# Patient Record
Sex: Female | Born: 1965 | Race: White | Hispanic: No | Marital: Married | Smoking: Never smoker
Health system: Southern US, Community
[De-identification: ages and names within clinical notes are randomized; demographics above are authoritative.]

## PROBLEM LIST (undated history)

## (undated) DIAGNOSIS — T7840XA Allergy, unspecified, initial encounter: Secondary | ICD-10-CM

## (undated) DIAGNOSIS — E079 Disorder of thyroid, unspecified: Secondary | ICD-10-CM

## (undated) DIAGNOSIS — C801 Malignant (primary) neoplasm, unspecified: Secondary | ICD-10-CM

## (undated) HISTORY — PX: WISDOM TOOTH EXTRACTION: SHX21

## (undated) HISTORY — DX: Allergy, unspecified, initial encounter: T78.40XA

## (undated) HISTORY — DX: Disorder of thyroid, unspecified: E07.9

## (undated) HISTORY — DX: Malignant (primary) neoplasm, unspecified: C80.1

## (undated) HISTORY — PX: TOTAL THYROIDECTOMY: SHX2547

---

## 1999-02-01 ENCOUNTER — Other Ambulatory Visit: Admission: RE | Admit: 1999-02-01 | Discharge: 1999-02-01 | Payer: Self-pay | Admitting: *Deleted

## 2000-02-03 ENCOUNTER — Other Ambulatory Visit: Admission: RE | Admit: 2000-02-03 | Discharge: 2000-02-03 | Payer: Self-pay | Admitting: *Deleted

## 2001-01-03 ENCOUNTER — Encounter: Payer: Self-pay | Admitting: *Deleted

## 2001-01-03 ENCOUNTER — Ambulatory Visit (HOSPITAL_COMMUNITY): Admission: RE | Admit: 2001-01-03 | Discharge: 2001-01-03 | Payer: Self-pay | Admitting: *Deleted

## 2001-01-21 ENCOUNTER — Other Ambulatory Visit: Admission: RE | Admit: 2001-01-21 | Discharge: 2001-01-21 | Payer: Self-pay | Admitting: Endocrinology

## 2001-02-13 ENCOUNTER — Encounter: Payer: Self-pay | Admitting: General Surgery

## 2001-02-15 ENCOUNTER — Ambulatory Visit (HOSPITAL_COMMUNITY): Admission: RE | Admit: 2001-02-15 | Discharge: 2001-02-16 | Payer: Self-pay | Admitting: General Surgery

## 2001-02-15 ENCOUNTER — Encounter (INDEPENDENT_AMBULATORY_CARE_PROVIDER_SITE_OTHER): Payer: Self-pay | Admitting: *Deleted

## 2001-05-07 ENCOUNTER — Encounter (HOSPITAL_COMMUNITY): Admission: RE | Admit: 2001-05-07 | Discharge: 2001-08-05 | Payer: Self-pay | Admitting: Endocrinology

## 2001-05-07 ENCOUNTER — Ambulatory Visit (HOSPITAL_COMMUNITY): Admission: RE | Admit: 2001-05-07 | Discharge: 2001-05-07 | Payer: Self-pay | Admitting: Endocrinology

## 2001-05-10 ENCOUNTER — Encounter: Payer: Self-pay | Admitting: Endocrinology

## 2001-05-21 ENCOUNTER — Encounter: Payer: Self-pay | Admitting: Endocrinology

## 2001-05-21 ENCOUNTER — Ambulatory Visit (HOSPITAL_COMMUNITY): Admission: RE | Admit: 2001-05-21 | Discharge: 2001-05-21 | Payer: Self-pay | Admitting: Endocrinology

## 2002-09-15 ENCOUNTER — Ambulatory Visit (HOSPITAL_COMMUNITY): Admission: RE | Admit: 2002-09-15 | Discharge: 2002-09-15 | Payer: Self-pay | Admitting: Endocrinology

## 2002-09-15 ENCOUNTER — Encounter: Payer: Self-pay | Admitting: Endocrinology

## 2002-10-01 ENCOUNTER — Ambulatory Visit (HOSPITAL_COMMUNITY): Admission: RE | Admit: 2002-10-01 | Discharge: 2002-10-01 | Payer: Self-pay | Admitting: Endocrinology

## 2002-10-01 ENCOUNTER — Encounter: Payer: Self-pay | Admitting: Endocrinology

## 2003-06-23 ENCOUNTER — Encounter (HOSPITAL_COMMUNITY): Admission: RE | Admit: 2003-06-23 | Discharge: 2003-09-21 | Payer: Self-pay | Admitting: Obstetrics and Gynecology

## 2003-10-30 ENCOUNTER — Other Ambulatory Visit: Admission: RE | Admit: 2003-10-30 | Discharge: 2003-10-30 | Payer: Self-pay | Admitting: Family Medicine

## 2004-01-15 ENCOUNTER — Encounter (HOSPITAL_COMMUNITY): Admission: RE | Admit: 2004-01-15 | Discharge: 2004-04-14 | Payer: Self-pay | Admitting: Endocrinology

## 2004-02-24 ENCOUNTER — Ambulatory Visit: Payer: Self-pay | Admitting: Endocrinology

## 2004-09-26 ENCOUNTER — Ambulatory Visit: Payer: Self-pay | Admitting: Endocrinology

## 2004-10-31 ENCOUNTER — Other Ambulatory Visit: Admission: RE | Admit: 2004-10-31 | Discharge: 2004-10-31 | Payer: Self-pay | Admitting: Family Medicine

## 2004-11-10 ENCOUNTER — Ambulatory Visit: Payer: Self-pay | Admitting: "Endocrinology

## 2004-12-28 ENCOUNTER — Ambulatory Visit: Payer: Self-pay | Admitting: "Endocrinology

## 2004-12-30 ENCOUNTER — Encounter (HOSPITAL_COMMUNITY): Admission: RE | Admit: 2004-12-30 | Discharge: 2005-03-30 | Payer: Self-pay | Admitting: Endocrinology

## 2005-03-16 ENCOUNTER — Ambulatory Visit: Payer: Self-pay | Admitting: "Endocrinology

## 2005-10-03 ENCOUNTER — Ambulatory Visit: Payer: Self-pay | Admitting: "Endocrinology

## 2005-11-10 ENCOUNTER — Other Ambulatory Visit: Admission: RE | Admit: 2005-11-10 | Discharge: 2005-11-10 | Payer: Self-pay | Admitting: Family Medicine

## 2006-01-17 ENCOUNTER — Encounter: Admission: RE | Admit: 2006-01-17 | Discharge: 2006-01-17 | Payer: Self-pay | Admitting: "Endocrinology

## 2006-04-06 ENCOUNTER — Emergency Department (HOSPITAL_COMMUNITY): Admission: EM | Admit: 2006-04-06 | Discharge: 2006-04-07 | Payer: Self-pay | Admitting: Emergency Medicine

## 2006-04-07 ENCOUNTER — Ambulatory Visit (HOSPITAL_COMMUNITY): Admission: RE | Admit: 2006-04-07 | Discharge: 2006-04-07 | Payer: Self-pay | Admitting: Family Medicine

## 2007-01-09 ENCOUNTER — Ambulatory Visit: Payer: Self-pay | Admitting: "Endocrinology

## 2007-08-13 ENCOUNTER — Ambulatory Visit: Payer: Self-pay | Admitting: "Endocrinology

## 2008-02-05 ENCOUNTER — Ambulatory Visit: Payer: Self-pay | Admitting: "Endocrinology

## 2008-09-07 ENCOUNTER — Encounter: Admission: RE | Admit: 2008-09-07 | Discharge: 2008-09-07 | Payer: Self-pay | Admitting: Family Medicine

## 2008-11-04 ENCOUNTER — Other Ambulatory Visit: Admission: RE | Admit: 2008-11-04 | Discharge: 2008-11-04 | Payer: Self-pay | Admitting: Family Medicine

## 2009-07-14 ENCOUNTER — Ambulatory Visit: Payer: Self-pay | Admitting: "Endocrinology

## 2010-04-16 ENCOUNTER — Encounter: Payer: Self-pay | Admitting: "Endocrinology

## 2010-07-06 ENCOUNTER — Ambulatory Visit (INDEPENDENT_AMBULATORY_CARE_PROVIDER_SITE_OTHER): Payer: Self-pay | Admitting: "Endocrinology

## 2010-07-06 DIAGNOSIS — R231 Pallor: Secondary | ICD-10-CM

## 2010-07-06 DIAGNOSIS — R7309 Other abnormal glucose: Secondary | ICD-10-CM

## 2010-07-06 DIAGNOSIS — C73 Malignant neoplasm of thyroid gland: Secondary | ICD-10-CM

## 2010-07-06 DIAGNOSIS — E0580 Other thyrotoxicosis without thyrotoxic crisis or storm: Secondary | ICD-10-CM

## 2010-07-06 DIAGNOSIS — R1013 Epigastric pain: Secondary | ICD-10-CM

## 2010-08-11 ENCOUNTER — Other Ambulatory Visit: Payer: Self-pay | Admitting: *Deleted

## 2010-08-11 DIAGNOSIS — E890 Postprocedural hypothyroidism: Secondary | ICD-10-CM

## 2010-08-11 DIAGNOSIS — C73 Malignant neoplasm of thyroid gland: Secondary | ICD-10-CM

## 2010-08-12 NOTE — Op Note (Signed)
[facility]  Patient:    Becky Martinez, Becky Martinez Visit Number: [id number] MRN: [id number]          Service Type: DSU Location: [phone] Attending Physician:  Becky Martinez Dictated by:   Becky Martinez, M.D. Proc. Date: 02/15/01 Admit Date:  02/15/2001   CC:         Becky Martinez, M.D.  [facility]   Operative Report  PREOPERATIVE DIAGNOSIS:  Left thyroid nodule.  POSTOPERATIVE DIAGNOSIS:  Papillary carcinoma of the thyroid.  PROCEDURE:  Total thyroidectomy with frozen section.  SURGEON:  Becky Martinez, M.D.  FIRST ASSISTANT: Becky Martinez, M.D.  OPERATIVE INDICATION:  This is a 45 year old white female who noticed a lump in her left thyroid gland for one year.  It is painless.  It has enlarged. Thyroid function tests are within normal limits.  A thyroid ultrasound shows a multinodular goiter with small nodules on the right and a 29 x 21 mm nodule on the left.  Dr. Adela Martinez has performed a fine needle aspiration cytology in the office, which shows atypical follicular cells, raising the question of follicular adenoma or papillary carcinoma.  I have examined her and agree that she has a left thyroid mass, which is smooth and mobile, and there is no adenopathy in the neck.  She is brought to the operating room electively for thyroid surgery.  OPERATIVE FINDINGS:  The patient had about a 3 cm firm nodule on the left thyroid lobe, which appeared to be completely contained within the left thyroid lobe.  Frozen section diagnosis by Dr. Almyra Martinez revealed papillary carcinoma.  I did not palpate any nodules in the right thyroid lobe.  I did not palpate any adenopathy.  DESCRIPTION OF PROCEDURE:  Following the induction of general endotracheal anesthesia, the patient was placed in the supine position with a roll between her shoulders and her neck extended.  The neck was prepped and draped in a sterile fashion.  A  curved transverse incision was made approximately 2 cm above the clavicle.  Dissection was carried down through the platysma muscle. The flaps were raised superiorly and inferiorly in the plane between the platysma muscle and the strap muscles.  The strap muscles were divided in the midline and were sharply mobilized away from the thyroid gland bilaterally. Small bleeders were controlled with electrocautery.  Larger bleeders were controlled with 3-0 Vicryl ties and 3-0 Vicryl suture ligatures.  We mobilized the left thyroid lobe with the large nodule present.  We mobilized the lower pole fairly easily, controlling small bleeders with metal clips.  We isolated the superior pole vessels and controlled these with hemostats, divided the superior pole vessels, and ligated the superior pole vessels on the left with 2-0 silk ties.  We then carefully dissected the thyroid gland away from its lateral attachments, staying right on the capsule of the gland.  Staying right on the capsule of the gland allowed [place] to preserve the parathyroid glands and the recurrent laryngeal nerve.  Small vascular structures were controlled with metal clips.  We took the dissection all the way up onto the anterior wall of the trachea and then isolated the isthmus of the thyroid with hemostats and divided the thyroid gland.  We tied off the stumps of the thyroid isthmus with 3-0 Vicryl suture ligatures. Hemostasis was excellent.  Surgicel gauze was placed.  Frozen section diagnosis by Dr. Almyra Martinez revealed papillary carcinoma of the thyroid.  This was  unequivocal.  We felt that we should proceed with a completion thyroidectomy.  We then exposed the right thyroid lobe.  In similar fashion we mobilized the inferior pole without too much difficulty.  There was one venous structure which was controlled with metal clips and divided.  We isolated the superior pole vessels quite easily, controlled these with medium metal clips,  and divided this as well.  Again on this side, the technique was to stay right in the capsule of the thyroid gland, controlling small vascular structures with metal clips.  This allowed [place] to preserve these parathyroid glands and avoid injury to the recurrent laryngeal nerve.  We then brought the thyroid gland up onto the trachea and then dissected it off.  The right thyroid lobe was then sent for routine histology.  The wound was completely irrigated with saline.  Hemostasis was excellent.  We placed a little bit of Surgicel gauze in the posterior bed of the thyroid on each side.  After about 10 minutes of observation, we felt that there was no bleeding.  The strap muscles were closed in the midline with a running suture of 3-0 Vicryl.  The platysma muscle was closed with interrupted sutures of 3-0 Vicryl and skin closed with a few skin staples and Steri-Strips.  Clean bandages were placed and the patient taken to the recovery room in stable condition.  Estimated blood loss was about 50-75 cc.  Complications:  None. The sponge and instrument counts were correct. Dictated by:   Becky Martinez, M.D. Attending Physician:  Becky Martinez DD:  02/15/01 TD:  02/16/01 Job: [phone] [id number]

## 2010-11-11 LAB — T4, FREE: Free T4: 1.5 ng/dL (ref 0.80–1.80)

## 2010-11-11 LAB — T3, FREE: T3, Free: 2.4 pg/mL (ref 2.3–4.2)

## 2011-02-13 ENCOUNTER — Other Ambulatory Visit: Payer: Self-pay | Admitting: "Endocrinology

## 2011-02-21 ENCOUNTER — Other Ambulatory Visit: Payer: Self-pay | Admitting: *Deleted

## 2011-02-21 DIAGNOSIS — E611 Iron deficiency: Secondary | ICD-10-CM

## 2011-02-21 DIAGNOSIS — E890 Postprocedural hypothyroidism: Secondary | ICD-10-CM

## 2011-02-21 MED ORDER — SYNTHROID 200 MCG PO TABS: 200.0000 ug | ORAL_TABLET | Freq: Every day | ORAL | Status: DC

## 2011-02-21 MED ORDER — FERROUS SULFATE 325 (65 FE) MG PO TABS: 325.0000 mg | ORAL_TABLET | Freq: Every day | ORAL | Status: DC

## 2011-06-26 ENCOUNTER — Ambulatory Visit: Payer: BC Managed Care – PPO | Admitting: "Endocrinology

## 2011-09-06 ENCOUNTER — Ambulatory Visit: Payer: BC Managed Care – PPO | Admitting: "Endocrinology

## 2012-01-13 ENCOUNTER — Other Ambulatory Visit: Payer: Self-pay | Admitting: "Endocrinology

## 2012-01-24 ENCOUNTER — Encounter: Payer: Self-pay | Admitting: "Endocrinology

## 2012-01-24 ENCOUNTER — Ambulatory Visit (INDEPENDENT_AMBULATORY_CARE_PROVIDER_SITE_OTHER): Payer: BC Managed Care – PPO | Admitting: "Endocrinology

## 2012-01-24 VITALS — BP 126/87 | HR 82 | Wt 291.0 lb

## 2012-01-24 DIAGNOSIS — E890 Postprocedural hypothyroidism: Secondary | ICD-10-CM

## 2012-01-24 DIAGNOSIS — R7309 Other abnormal glucose: Secondary | ICD-10-CM

## 2012-01-24 DIAGNOSIS — C73 Malignant neoplasm of thyroid gland: Secondary | ICD-10-CM

## 2012-01-24 DIAGNOSIS — E0580 Other thyrotoxicosis without thyrotoxic crisis or storm: Secondary | ICD-10-CM

## 2012-01-24 DIAGNOSIS — R7303 Prediabetes: Secondary | ICD-10-CM

## 2012-01-24 DIAGNOSIS — E611 Iron deficiency: Secondary | ICD-10-CM

## 2012-01-24 DIAGNOSIS — R231 Pallor: Secondary | ICD-10-CM

## 2012-01-24 DIAGNOSIS — E669 Obesity, unspecified: Secondary | ICD-10-CM

## 2012-01-24 DIAGNOSIS — D509 Iron deficiency anemia, unspecified: Secondary | ICD-10-CM

## 2012-01-24 DIAGNOSIS — I10 Essential (primary) hypertension: Secondary | ICD-10-CM

## 2012-01-24 LAB — TSH: TSH: 45.646 u[IU]/mL — ABNORMAL HIGH (ref 0.350–4.500)

## 2012-01-24 LAB — T4, FREE: Free T4: 0.78 ng/dL — ABNORMAL LOW (ref 0.80–1.80)

## 2012-01-24 MED ORDER — FERROUS SULFATE 325 (65 FE) MG PO TABS: 325.0000 mg | ORAL_TABLET | Freq: Every day | ORAL | Status: DC

## 2012-01-24 NOTE — Progress Notes (Signed)
Subjective:  Patient Name: Becky Martinez Date of Birth: [birth year]  MRN: [id number]  Becky Martinez  presents to the office today for follow-up of her thyroid cancer, post-surgical hypothyroidism, iatrogenic hyperthyroidism, obesity, headaches, pallor, hypocalcemia, hypertension, fatigue, and pre-diabetes.  HISTORY OF PRESENT ILLNESS:   Becky Martinez is a 46 y.o. Caucasian woman.  Becky Martinez was accompanied by her daughter.   1. The patient developed thyroid nodules in 2002. A thyroid ultrasound on 01/03/01 showed a multinodular goiter with a dominant nodule in the right lobe measuring 21/21/29 mm. On 02/15/01 she underwent a partial thyroidectomy. 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 There was no microscopic evidence of extension of the tumor beyond the capsule. In the left lobe a 0.8 cm focus of papillary thyroid cancer was noted. No lymphovascular invasion or extracapsular extension was noted. An I-131 scan performed on 05/11/01 showed residual uptake in the thyroid bed. On 05/27/01 she received a therapeutic I-131 dose of 40.3 mCi. 3. On 09/18/02 she had a follow up I-131 nuclear scan. This study demonstrated residual activity in the right thyroid bed, compatible with residual functioning thyroid tissue and/or tumor. On 10/01/02 she received a 27 mCi dose of I-131. On 06/26/03 she had a follow up I-131 scan which showed some residual activity in the right thyroid bed area. On 07/01/03 she received 100 mCi of I-131. Follow up I-131 scan on 01/18/04 showed no residual thyroid bed activity. 2. I assumed her care in 2006. Follow up I-131 scan on 01/03/05 showed no residual thyroid bed activity. After ensuring that her TSH was then appropriately suppressed by supraphysiologic doses of Synthroid, I ordered a Thyrogen-stimulated I-131 scan on 01/19/06. This study again showed no evidence of residual thyroid bed activity. Since then I have followed her  with serial thyroglobulin measurements and anti-thyroglobulin antibody measurements. Her thyroglobulin values have consistently been below the lower limit of detection of the assay. Her anti-thyroglobulin antibody values have consistently been below 20. 3. The patient's last [facility] visit was on 06/26/10:  In the interim, she has been healthy. 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 She remains on Synthroid, 200 mcg/day in the AM and ferrous sulfate in the evening.  4. Pertinent Review of Systems:  Constitutional: The patient feels "good". "I can't complain." Eyes: Vision is good. There are no significant eye complaints. Neck: The patient has no complaints of anterior neck swelling, soreness, tenderness,  pressure, discomfort, or difficulty swallowing.  Heart: Heart rate increases with exercise or other physical activity. The patient has no complaints of palpitations, irregular heat beats, chest pain, or chest pressure. Gastrointestinal: Bowel movents seem normal. The patient has no complaints of excessive hunger, acid reflux, upset stomach, stomach aches or pains, diarrhea, or constipation. Legs: Muscle mass and strength seem normal. There are no complaints of numbness, tingling, burning, or pain. No edema is noted. Feet: There are no obvious foot problems. There are no complaints of numbness, tingling, burning, or pain. No edema is noted. GYN: LMP about 4 weeks ago. She has PMS symptoms now. Periods are regular, but the duration of flow varies somewhat.    PAST MEDICAL, FAMILY, AND SOCIAL HISTORY:  No past medical history on file.  No family history on file.  Current outpatient prescriptions:ferrous sulfate 325 (65 FE) MG tablet, Take 1 tablet (325 mg total) by mouth daily., Disp: 30 tablet, Rfl: 5;  SYNTHROID 200 MCG tablet, TAKE 1 TABLET (200 MCG TOTAL) BY MOUTH DAILY., Disp: 30 tablet, Rfl: 1  Allergies as of 01/24/2012  . (No Known  Allergies)    1. 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  2. Activities: No physical activity 3. Smoking, alcohol, or drugs: None 4. Primary Care Provider: [facility]  ROS: There are no other significant problems involving Becky Martinez's other body systems.   Objective:  Vital Signs:  BP 126/87  Pulse 82  Wt 291 lb (131.997 kg)   Ht Readings from Last 3 Encounters:  No data found for Ht   Wt Readings from Last 3 Encounters:  01/24/12 291 lb (131.997 kg)   Her weight has increased 6 pounds in 18 months.    PHYSICAL EXAM:  Constitutional: The patient appears healthy, but obese. Her skin color is now normally pink.   Face: The face appears normal.  Eyes: There is no obvious arcus or proptosis. Moisture appears normal. Mouth: The oropharynx and tongue appear normal. Oral moisture is normal. Neck: The neck appears to be visibly normal. No carotid bruits are noted. The thyroid gland is absent. She has a small amount of residual induration of the left strap muscle. She has no palpable cervical nodes or massed in the thyroid bed. Her supraclavicular areas are clear.  Lungs: The lungs are clear to auscultation. Air movement is good. Heart: Heart rate and rhythm are regular. Heart sounds S1 and S2 are normal. I did not appreciate any pathologic cardiac murmurs. Abdomen: The abdomen is enlarged. Bowel sounds are normal. There is no obvious hepatomegaly, splenomegaly, or other mass effect.  Arms: Muscle size and bulk are normal for age. Hands: There is no obvious tremor. Phalangeal and metacarpophalangeal joints are normal. Palmar muscles are normal. Palmar skin is normal. Palmar moisture is also normal. Legs: Muscles appear normal for age. No edema is present. Neurologic: Strength is normal for age in both the upper and lower extremities. Muscle tone is normal. Sensation to touch is normal in both legs.    LAB DATA: 06/26/10:  Thyroglobulin < 0.2, thyroglobulin antibody < 20, TSH 0.199, free T4 1.71, free T3 2.7, HbA1c 4.7%   Assessment and Plan:   ASSESSMENT:  1. Thyroid cancer: She has no clinical evidence for recurrence. Her thyroglobulin levels have been below the lower limit of detection of the assay ("unmeasurable") since we began measuring them at her first visit with me in 2006.  It appeared at her last clinic visit that she had probably been cured of thyroid cancer. If her thyroglobulin is still "unmeasurable" today, we can safely reduce her level of Synthroid somewhat.   2. Surgical hypothyroidism/iatrogenic hyperthyroidism: Her TSH was appropriately suppressed in 2012. We need to see her TFTs now.  3. Pallor: Her color has improved greatly on iron supplementation.  4. Hypertension: Her diastolic BP is still too high. Thirty minutes of exercise per day would help. 5. Obesity: She has gained 6 pounds in 18 months, equivalent to a net calorie gain of about 35 calories per day.  6. Pre-diabetes: Her HbA1c was 4.7% last year, well within normal limits.   PLAN:  1. Diagnostic: TFTs, thyroglobulin panel  2. Therapeutic: Adjust Synthroid dose as needed. Eat right. Exercise at least 30 minutes 5 days per week.  3. Patient education: We discussed her obesity, hypertensin, and pre-DM. We also discussed her papillary thyroid cancer. 4. Follow-up: 12 months  Level of  Service: This visit lasted in excess of 40 minutes. More than 50% of the visit was devoted to counseling.  David Stall, MD 01/24/2012 1:36 PM  Addendum:  Objective: Lab results 01/24/12: TSH 45.64, free T4 0.78, free T3 1.9, thyroglobulin <0.2, anti-thyroglobulin antibody < 20  Assessment:  1. Thyroid cancer: Thyroglobulin is still "unmeasurable". Anti-thyroglobulin antibody is normal.  2. Iatrogenic hyperthyroidism: patient is profoundly hypothyroid, which would certainly explain her fatigue. At her visit she said she was taking the  Synthroid reliably, about 12 hours apart from her Synthroid doses.  Plan: I will call her and follow up. David Stall

## 2012-01-24 NOTE — Patient Instructions (Signed)
Follow up visit in one year. 

## 2012-01-25 LAB — THYROGLOBULIN ANTIBODY: Thyroglobulin Ab: <20 U/mL (ref <40.0)

## 2012-01-28 DIAGNOSIS — E890 Postprocedural hypothyroidism: Secondary | ICD-10-CM | POA: Insufficient documentation

## 2012-01-28 DIAGNOSIS — R7303 Prediabetes: Secondary | ICD-10-CM | POA: Insufficient documentation

## 2012-01-28 DIAGNOSIS — E669 Obesity, unspecified: Secondary | ICD-10-CM | POA: Insufficient documentation

## 2012-01-28 DIAGNOSIS — E0580 Other thyrotoxicosis without thyrotoxic crisis or storm: Secondary | ICD-10-CM | POA: Insufficient documentation

## 2012-01-28 DIAGNOSIS — C73 Malignant neoplasm of thyroid gland: Secondary | ICD-10-CM | POA: Insufficient documentation

## 2012-01-28 DIAGNOSIS — I10 Essential (primary) hypertension: Secondary | ICD-10-CM | POA: Insufficient documentation

## 2012-01-29 ENCOUNTER — Telehealth: Payer: Self-pay | Admitting: "Endocrinology

## 2012-01-29 MED ORDER — LEVOTHYROXINE SODIUM 150 MCG PO TABS: ORAL_TABLET | ORAL | Status: DC

## 2012-01-29 NOTE — Telephone Encounter (Signed)
!.   I contacted the patient with the results of her lab tests from 01/24/12: TSH was 45.646, free T4 was 0.078, free T3 1.9. Her thyroglobulin was < 0.2, Her anti-thyroglobulin antibody was < 20.  2. She says that she is taking Synthroid 200 mcg, daily. She admits missing three doses. She takes the synthroid about 5:30-6 AM. She takes her iron around 9 PM.  3. Assessment: She is profoundly hypothyroid, likely due to complexing of Synthroid with iron still in the intestine. She shows no signs of cancer recurrence once again.  4. Plan: Change time of iron to about 6 PM. Continue Synthroid at 6 AM. For the next week, take two Synthroid 200 mcg tablets daily. Thereafter, take Synthroid 300 mcg/day (2 of 150 mcg tablets per day).Repeat TFTs in 6 weeks. Call me if she starts to feel hyper.  Send in scrip to [facility] on [address].   David Stall

## 2012-01-29 NOTE — Addendum Note (Signed)
Addended by: David Stall on: 01/29/2012 10:48 PM   Modules accepted: Orders

## 2012-09-21 ENCOUNTER — Other Ambulatory Visit: Payer: Self-pay | Admitting: "Endocrinology

## 2012-10-23 ENCOUNTER — Other Ambulatory Visit: Payer: Self-pay | Admitting: "Endocrinology

## 2012-10-23 DIAGNOSIS — E038 Other specified hypothyroidism: Secondary | ICD-10-CM

## 2012-12-03 ENCOUNTER — Other Ambulatory Visit: Payer: Self-pay | Admitting: *Deleted

## 2012-12-03 DIAGNOSIS — E038 Other specified hypothyroidism: Secondary | ICD-10-CM

## 2012-12-03 DIAGNOSIS — E890 Postprocedural hypothyroidism: Secondary | ICD-10-CM

## 2012-12-03 MED ORDER — SYNTHROID 200 MCG PO TABS: ORAL_TABLET | ORAL | Status: DC

## 2013-04-08 ENCOUNTER — Ambulatory Visit (INDEPENDENT_AMBULATORY_CARE_PROVIDER_SITE_OTHER): Payer: BC Managed Care – PPO | Admitting: *Deleted

## 2013-04-08 ENCOUNTER — Other Ambulatory Visit: Payer: BC Managed Care – PPO | Admitting: *Deleted

## 2013-04-08 DIAGNOSIS — Z23 Encounter for immunization: Secondary | ICD-10-CM

## 2013-04-08 NOTE — Progress Notes (Signed)
Patient here for Flu Shot. I do not have the appropriate icon to enter the injection information into the EPIC format.  See information below: 1. Prefilled - Fluarix Quadrivalent 2. Lot [id number] 3. Exp:  09/23/2013 4. GlaxoSmithKline 5. 0.5 ml  Contraindications review with patient. She denies having any of the contraindications listed on the Flu Vaccine Consent Form. Possible  local, systemic and immediate / allergic reactions reviewed with patient. Consent Form signed.  0.5 ml Prefilled-Fluarix Quadrivalent Flu Vaccine injected IM into left deltoid muscle with problem.  Patient remained in my office x 15 minutes with no adverse reaction.

## 2013-04-09 DIAGNOSIS — Z23 Encounter for immunization: Secondary | ICD-10-CM

## 2013-05-01 ENCOUNTER — Other Ambulatory Visit: Payer: Self-pay | Admitting: *Deleted

## 2013-05-01 DIAGNOSIS — E038 Other specified hypothyroidism: Secondary | ICD-10-CM

## 2013-06-03 LAB — T3, FREE: T3, Free: 4.3 pg/mL — ABNORMAL HIGH (ref 2.3–4.2)

## 2013-06-03 LAB — HEMOGLOBIN A1C
HEMOGLOBIN A1C: 5.4 % (ref <5.7)
Mean Plasma Glucose: 108 mg/dL (ref <117)

## 2013-06-03 LAB — T4, FREE: Free T4: 2.81 ng/dL — ABNORMAL HIGH (ref 0.80–1.80)

## 2013-06-03 LAB — TSH: TSH: 0.017 u[IU]/mL — ABNORMAL LOW (ref 0.350–4.500)

## 2013-06-04 LAB — THYROGLOBULIN ANTIBODY PANEL
Thyroglobulin Ab: <20 U/mL (ref <40.0)
Thyroglobulin: <0.2 ng/mL (ref 0.0–55.0)
Thyroperoxidase Ab SerPl-aCnc: 11.2 IU/mL (ref <35.0)

## 2013-06-09 ENCOUNTER — Ambulatory Visit (INDEPENDENT_AMBULATORY_CARE_PROVIDER_SITE_OTHER): Payer: BC Managed Care – PPO | Admitting: "Endocrinology

## 2013-06-09 ENCOUNTER — Encounter: Payer: Self-pay | Admitting: "Endocrinology

## 2013-06-09 VITALS — BP 131/89 | HR 93 | Wt 295.0 lb

## 2013-06-09 DIAGNOSIS — E0580 Other thyrotoxicosis without thyrotoxic crisis or storm: Secondary | ICD-10-CM

## 2013-06-09 DIAGNOSIS — R7303 Prediabetes: Secondary | ICD-10-CM

## 2013-06-09 DIAGNOSIS — E669 Obesity, unspecified: Secondary | ICD-10-CM

## 2013-06-09 DIAGNOSIS — R7309 Other abnormal glucose: Secondary | ICD-10-CM

## 2013-06-09 DIAGNOSIS — I10 Essential (primary) hypertension: Secondary | ICD-10-CM

## 2013-06-09 DIAGNOSIS — C73 Malignant neoplasm of thyroid gland: Secondary | ICD-10-CM

## 2013-06-09 DIAGNOSIS — E890 Postprocedural hypothyroidism: Secondary | ICD-10-CM

## 2013-06-09 NOTE — Progress Notes (Signed)
Subjective:  Patient Name: Becky Martinez Date of Birth: [birth year]  MRN: [id number]  Becky Martinez  presents to the office today for follow-up of her thyroid cancer, post-surgical hypothyroidism, iatrogenic hyperthyroidism, obesity, headaches, pallor, hypocalcemia, hypertension, fatigue, and pre-diabetes.  HISTORY OF PRESENT ILLNESS:   [name] is a 48 y.o. Caucasian woman.  [name] was unaccompanied.   1. The patient developed thyroid nodules in 2002. A thyroid ultrasound on 01/03/01 showed a multinodular goiter with a dominant nodule in the right lobe measuring 21/21/29 mm. On 02/15/01 she underwent a partial thyroidectomy. 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 There was no microscopic evidence of extension of the tumor beyond the capsule. In the left lobe a 0.8 cm focus of papillary thyroid cancer was noted. No lymphovascular invasion or extracapsular extension was noted. An I-131 scan performed on 05/11/01 showed residual uptake in the thyroid bed. On 05/27/01 she received a therapeutic I-131 dose of 40.3 mCi. 3. On 09/18/02 she had a follow up I-131 nuclear scan. This study demonstrated residual activity in the right thyroid bed, compatible with residual functioning thyroid tissue and/or tumor. On 10/01/02 she received a 27 mCi dose of I-131. On 06/26/03 she had a follow up I-131 scan which showed some residual activity in the right thyroid bed area. On 07/01/03 she received 100 mCi of I-131. Follow up I-131 scan on 01/18/04 showed no residual thyroid bed activity.  2. I assumed her care in 2006. Follow up I-131 scan on 01/03/05 showed no residual thyroid bed activity. After ensuring that her TSH was then appropriately suppressed by supraphysiologic doses of Synthroid, I ordered a Thyrogen-stimulated I-131 scan on 01/19/06. This study again showed no evidence of residual thyroid bed activity. Since then I have followed her with serial  thyroglobulin measurements and anti-thyroglobulin antibody measurements. Her thyroglobulin values have consistently been below the lower limit of detection of the assay. Her anti-thyroglobulin antibody values have consistently been below 20.  3. The patient's last [facility] visit was on 01/14/12:  In the interim, she has been healthy. 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 She remains on Synthroid, 200 mcg, 1.5 tablets each AM and 325 mg ferrous sulfate in the evening.   4. Pertinent Review of Systems:  Constitutional: The patient feels "fine". She sleeps well. Body temperature is normal.  Eyes: Vision is good. Presbyopia is in progress. There are no other  significant eye complaints. Neck: The patient has no complaints of anterior neck swelling, soreness, tenderness,  pressure, discomfort, or difficulty swallowing.  Heart: Heart rate increases with exercise or other physical activity. The patient has no complaints of palpitations, irregular heat beats, chest pain, or chest pressure. Gastrointestinal: Bowel movents seem normal. The patient has no complaints of excessive hunger, acid reflux, upset stomach, stomach aches or pains, diarrhea, or constipation. Legs: Muscle mass and strength seem normal. There are no complaints of numbness, tingling, burning, or pain. No edema is noted. Feet: There are no obvious foot problems. There are no complaints of numbness, tingling, burning, or pain. No edema is noted. GYN: LMP about one week ago. She has more PMS symptoms now. She also has cramps for the first time. Periods are regular.    PAST MEDICAL, FAMILY, AND SOCIAL HISTORY:  No past medical history on file.  No family history on file.  Current outpatient prescriptions:ferrous sulfate 325 (65 FE) MG  tablet, TAKE 1 TABLET BY MOUTH EVERY DAY, Disp: 30 tablet, Rfl: 3;  SYNTHROID 200 MCG tablet, Take 1.5 (364mcg total) by mouth daily, Disp: 45 tablet, Rfl:  4;  levothyroxine (SYNTHROID) 150 MCG tablet, Take two Synthroid 150 mcg tablets each morning around 6 AM.Brand name is medically necessary., Disp: 60 tablet, Rfl: 6  Allergies as of 06/09/2013  . (No Known Allergies)    1. 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  2. Activities: No physical activity 3. Smoking, alcohol, or drugs: None 4. Primary Care Provider: Dr. [facility] [place]: There are no other significant problems involving Bellarose's other body systems.   Objective:  Vital Signs:  BP 131/89  Pulse 93  Wt 295 lb (133.811 kg)   Ht Readings from Last 3 Encounters:  No data found for Ht   Wt Readings from Last 3 Encounters:  06/09/13 295 lb (133.811 kg)  01/24/12 291 lb (131.997 kg)   Her weight has increased 4 pounds in 17 months.   PHYSICAL EXAM:  Constitutional: The patient appears healthy, but obese. Her skin color is now normally pink.   Face: The face appears normal.  Eyes: There is no obvious arcus or proptosis. Moisture appears normal. Mouth: The oropharynx and tongue appear normal. Oral moisture is normal. Neck: The neck appears to be visibly normal. No carotid bruits are noted. The thyroid gland is absent. The previous induration of her left strap muscle has resolved. She has no palpable cervical nodes or massed in the thyroid bed. Her supraclavicular areas are clear.  Lungs: The lungs are clear to auscultation. Air movement is good. Heart: Heart rate and rhythm are regular. Heart sounds S1 and S2 are normal. I did not appreciate any pathologic cardiac murmurs. Abdomen: The abdomen is enlarged. Bowel sounds are normal. There is no obvious hepatomegaly, splenomegaly, or other mass effect.  Arms: Muscle size and bulk are normal for age. Hands: There is no obvious tremor. Phalangeal and metacarpophalangeal joints are normal. Palmar muscles are normal. Palmar skin is normal. Palmar moisture is also  normal. Legs: Muscles appear normal for age. No edema is present. Neurologic: Strength is normal for age in both the upper and lower extremities. Muscle tone is normal. Sensation to touch is normal in both legs.    LAB DATA:  Lab 06/03/13: HbA1c 5.4%; TSH 0.017, free T4 2.81, free T3 4.3, TPO antibody 11.2, thyroglobulin < 0.2, Tg antibody < 20  Lab 01/24/12: TSH 45.646, free T4 0.78, free T3 1.9, thyroglobulin < 0.2, Tg antibody < 20  Lab 11/10/10: TSH 4.896, free T4 1.50, free T3 2.4  Lab 06/26/10: Thyroglobulin < 0.2, thyroglobulin antibody < 20, TSH 0.199, free T4 1.71, free T3 2.7, HbA1c 4.7%   Assessment and Plan:   ASSESSMENT:  1. Thyroid cancer: She has no clinical evidence for recurrence. Her thyroglobulin levels have been below the lower limit of detection of the assay ("unmeasurable") since we began measuring them at her first visit with me in 2006.  It appeared at her last clinic visit that she had probably been cured of thyroid cancer. Since her thyroglobulin is still "unmeasurable" today, 17 months later, her risk of having a recurrence of thyroid cancer is very low. We can safely reduce her level of TSH suppression and her Synthroid somewhat.   2. Surgical hypothyroidism/iatrogenic hyperthyroidism: Her TSH was appropriately suppressed in 2012, but very high in 2013. Her TSH is suppressed  again today.   3. Pallor: Her color has improved greatly on iron supplementation.  4. Hypertension: Her diastolic BP is still too high. Thirty minutes of exercise per day would help. 5. Obesity: She has gained 4 pounds in 17 months, equivalent to a net calorie gain of about 35 calories per day. I've offered her the new injectable drug from [facility], Saxenda. Since she does not have thyroid, she is not at risk of developing thyroid cancer from the drug.  6. Pre-diabetes: Strictly speaking, her HbA1c is still within normal limits. Her HbA1c was 4.7% 2-1/2 years ago. Her Hba1c is 5.4% now. She is  definitely trending upward.    PLAN:  1. Diagnostic: Repeat TFTs, thyroglobulin panel in 3 months and 6 months.  2. Therapeutic: Reduce the Synthroid dose to 1.5 tablets 5 days per week and 1 tablet per day on Sundays and Wednesdays. Consider treatment with Saxenda. Eat right. Exercise at least 30 minutes 5 days per week.  3. Patient education: We discussed the fact that her papillary thyroid cancer seems to have been cured. We also discussed her obesity, hypertensin, and pre-DM.  4. Follow-up: 6 months  Level of Service: This visit lasted in excess of 40 minutes. More than 50% of the visit was devoted to counseling.  Sherrlyn Hock, MD 06/09/2013 3:08 PM

## 2013-06-09 NOTE — Patient Instructions (Signed)
Follow up visit in 6 months. Please change Synthroid dose as follows: Take 1.5 of the 200 mcg pills per day for 5 days of every week. Take one pill per day twice a week, such as on Wednesdays and Sundays. Repeat blood tests in 3 and 6 months.

## 2013-06-23 ENCOUNTER — Other Ambulatory Visit: Payer: Self-pay | Admitting: "Endocrinology

## 2013-06-23 DIAGNOSIS — E038 Other specified hypothyroidism: Secondary | ICD-10-CM

## 2013-06-30 ENCOUNTER — Other Ambulatory Visit: Payer: Self-pay | Admitting: "Endocrinology

## 2013-07-07 ENCOUNTER — Telehealth: Payer: Self-pay | Admitting: "Endocrinology

## 2013-07-07 ENCOUNTER — Ambulatory Visit (INDEPENDENT_AMBULATORY_CARE_PROVIDER_SITE_OTHER): Payer: BC Managed Care – PPO | Admitting: "Endocrinology

## 2013-07-07 ENCOUNTER — Encounter: Payer: Self-pay | Admitting: "Endocrinology

## 2013-07-07 VITALS — BP 130/84 | HR 81 | Wt 301.8 lb

## 2013-07-07 DIAGNOSIS — R590 Localized enlarged lymph nodes: Secondary | ICD-10-CM

## 2013-07-07 DIAGNOSIS — E669 Obesity, unspecified: Secondary | ICD-10-CM

## 2013-07-07 DIAGNOSIS — R21 Rash and other nonspecific skin eruption: Secondary | ICD-10-CM

## 2013-07-07 DIAGNOSIS — R599 Enlarged lymph nodes, unspecified: Secondary | ICD-10-CM

## 2013-07-07 LAB — POCT GLYCOSYLATED HEMOGLOBIN (HGB A1C): HEMOGLOBIN A1C: 4.9

## 2013-07-07 LAB — GLUCOSE, POCT (MANUAL RESULT ENTRY): POC Glucose: 111 mg/dl — AB (ref 70–99)

## 2013-07-07 NOTE — Telephone Encounter (Signed)
Returned TC to patient to advised that I spoke with Dr. Tobe Sos, and he said that he can see her today at 3pm, patient said will come in. [place]

## 2013-07-07 NOTE — Progress Notes (Signed)
Subjective:  Patient Name: Becky Martinez Date of Birth: [birth year]  MRN: [id number]  Becky Martinez  presents to the office today to determine if her new facial rash and swollen, painful knot at the angle of her left jaw represents a recurrence of there thyroid cancer.  HISTORY OF PRESENT ILLNESS:   Becky Martinez is a 48 y.o. Caucasian woman.  Becky Martinez was unaccompanied.   1. The patient developed thyroid nodules in 2002. A thyroid ultrasound on 01/03/01 showed a multinodular goiter with a dominant nodule in the right lobe measuring 21/21/29 mm. On 02/15/01 she underwent a partial thyroidectomy. 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 There was no microscopic evidence of extension of the tumor beyond the capsule. In the left lobe a 0.8 cm focus of papillary thyroid cancer was noted. No lymphovascular invasion or extracapsular extension was noted. An I-131 scan performed on 05/11/01 showed residual uptake in the thyroid bed. On 05/27/01 she received a therapeutic I-131 dose of 40.3 mCi. 3. On 09/18/02 she had a follow up I-131 nuclear scan. This study demonstrated residual activity in the right thyroid bed, compatible with residual functioning thyroid tissue and/or tumor. On 10/01/02 she received a 27 mCi dose of I-131. On 06/26/03 she had a follow up I-131 scan which showed some residual activity in the right thyroid bed area. On 07/01/03 she received 100 mCi of I-131. Follow up I-131 scan on 01/18/04 showed no residual thyroid bed activity.  2. I assumed her care in 2006. Follow up I-131 scan on 01/03/05 showed no residual thyroid bed activity. After ensuring that her TSH was then appropriately suppressed by supraphysiologic doses of Synthroid, I ordered a Thyrogen-stimulated I-131 scan on 01/19/06. This study again showed no evidence of residual thyroid bed activity. Since then I have followed her with serial thyroglobulin measurements and  anti-thyroglobulin antibody measurements. Her thyroglobulin values have consistently been below the lower limit of detection of the assay. Her anti-thyroglobulin antibody values have consistently been below 20.  3. The patient's last [facility] visit was on 06/09/13:  In the interim, she had been healthy until two weeks ago. At that point she developed a pimple/bump on her left cheek. She squeezed the pimple, but nothing came out. About one week ago she developed several new lumps in the left face, These lumps are subcutaneous, pink, and somewhat tender. About one day later she developed a tender knot just behind the left angle of the jaw. She has since developed several new lesions of the left face. She has lesions in the mid-facial and inferior distributions of the left Facial Nerve. She also has tenderness of the superior distribution of the left Facial Nerve   PAST MEDICAL, FAMILY, AND SOCIAL HISTORY:  No past medical history on file.  No family history on file.  Current outpatient prescriptions:ferrous sulfate 325 (65 FE) MG tablet, TAKE 1 TABLET BY MOUTH EVERY DAY, Disp: 30 tablet, Rfl: 3;  SYNTHROID 200 MCG tablet, Take 1.5 (329mcg total) by mouth daily, Disp: 45 tablet, Rfl: 4  Allergies as of 07/07/2013  . (No Known Allergies)    1. 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  2. Activities: No physical activity 3. Smoking, alcohol, or drugs: None 4. Primary Care Provider: [facility] [place]: There are no other significant problems involving Becky Martinez's other body systems.   Objective:  Vital Signs:  BP 130/84  Pulse 81  Wt 301 lb 12.8 oz (136.896 kg)   Ht Readings from Last 3 Encounters:  No data found for Ht   Wt Readings from Last 3 Encounters:  07/07/13 301 lb 12.8 oz (136.896 kg)  06/09/13 295 lb (133.811 kg)  01/24/12 291 lb (131.997 kg)   Her weight has increased 6 pounds in one month.   PHYSICAL  EXAM:  Constitutional: The patient appears healthy, but obese. Her skin color is now normally pink.   Face: The right side of her face appears normal. The left side of her face shows pink, swollen, subcutaneous macular lesions of the axillary and mandibular areas of her face. The first of these areas shows excoriation. She has about 8-10 of these lesions, varying in size from 4-5 mm to about 8-10 mm. All of these lesions are tender to touch. The larger they are the more tender.  She also has tenderness to palpation of the skin in the left temporal and left frontal areas of her face, without any visible or palpable lesions being present. Eyes: There is no obvious arcus or proptosis. Moisture appears normal. Mouth: The oropharynx and tongue appear normal. Oral moisture is normal. Neck: The neck appears to be visibly normal. No carotid bruits are noted. The thyroid gland is absent. The previous induration of her left strap muscle has resolved. She has no palpable cervical nodes or massed in the thyroid bed. Her supraclavicular areas are clear. However, she does have one swollen, tender lymph node at the angle of the left jaw, what appears to be the left superior superficial cervical node.   LAB DATA:  Lab 06/03/13: HbA1c 5.4%; TSH 0.017, free T4 2.81, free T3 4.3, TPO antibody 11.2, thyroglobulin < 0.2, Tg antibody < 20  Lab 01/24/12: TSH 45.646, free T4 0.78, free T3 1.9, thyroglobulin < 0.2, Tg antibody < 20  Lab 11/10/10: TSH 4.896, free T4 1.50, free T3 2.4  Lab 06/26/10: Thyroglobulin < 0.2, thyroglobulin antibody < 20, TSH 0.199, free T4 1.71, free T3 2.7, HbA1c 4.7%   Assessment and Plan:   ASSESSMENT:  1. Thyroid cancer: She has no clinical evidence for recurrence. Her thyroglobulin levels have been below the lower limit of detection of the assay ("unmeasurable") since we began measuring them at her first visit with me in 2006.  It appeared at her last clinic visit that she had probably been  cured of thyroid cancer. Since her thyroglobulin is still "unmeasurable" today, 17 months later, her risk of having a recurrence of thyroid cancer is very low. We can safely reduce her level of TSH suppression and her Synthroid somewhat.   2. Surgical hypothyroidism/iatrogenic hyperthyroidism: Her TSH was appropriately suppressed in 2012, but very high in 2013. Her TSH was suppressed again at her march visit. .   3. Facial rash: She appears to have a subcutaneous facial rash of the left side of her face, in the maxillary and mandibular distributions of the left Facial Nerve. She also has tenderness of the superior distribution of the left Facial Nerve. She has a swollen, tender lymph node that appears to be a superior superficial cervical node. The distribution of the lesions and pain suggests shingles. She could also have a viral infection. A bacterial infection or vasculitis would be much less likely. I contacted Dr. Amy Martinique at [facility]. Dr. Martinique graciously agreed to see the patient at 9:50 tomorrow morning.   PLAN:  1. Diagnostic: Evaluation by Dr. Martinique tomorrow  morning.  2. Therapeutic: Continue doxycycline unless directed otherwise by Dr. Martinique. Utilize other therapies as ordered by Dr. Martinique.  3. Patient education: We discussed the possible etiologies of her rash and lymphadenopathy. I assured her that these lesions do not represent a recurrence of thyroid cancer.  4. Follow-up: Call me tomorrow with the results of her consultation with Dr. Martinique.   Level of Service: This visit lasted in excess of 40 minutes. More than 50% of the visit was devoted to counseling.  Sherrlyn Hock, MD 07/07/2013 3:48 PM

## 2013-10-27 ENCOUNTER — Other Ambulatory Visit (HOSPITAL_COMMUNITY): Payer: Self-pay | Admitting: *Deleted

## 2013-10-27 DIAGNOSIS — M7989 Other specified soft tissue disorders: Secondary | ICD-10-CM

## 2013-10-28 ENCOUNTER — Encounter (HOSPITAL_COMMUNITY): Payer: BC Managed Care – PPO

## 2013-10-30 ENCOUNTER — Ambulatory Visit (HOSPITAL_COMMUNITY)
Admission: RE | Admit: 2013-10-30 | Discharge: 2013-10-30 | Disposition: A | Discharge status: Home / Self Care | Payer: BC Managed Care – PPO | Source: Ambulatory Visit | Attending: Cardiovascular Disease | Admitting: Cardiovascular Disease

## 2013-10-30 DIAGNOSIS — M7989 Other specified soft tissue disorders: Secondary | ICD-10-CM

## 2013-10-30 NOTE — Progress Notes (Signed)
Right Lower Extremity Venous Duplex Completed. No evidence for DVT or SVT. °Brianna L Mazza,RVT °

## 2013-11-05 ENCOUNTER — Telehealth (HOSPITAL_COMMUNITY): Payer: Self-pay | Admitting: *Deleted

## 2013-12-10 ENCOUNTER — Encounter: Payer: Self-pay | Admitting: "Endocrinology

## 2013-12-10 ENCOUNTER — Ambulatory Visit (INDEPENDENT_AMBULATORY_CARE_PROVIDER_SITE_OTHER): Payer: BC Managed Care – PPO | Admitting: "Endocrinology

## 2013-12-10 VITALS — BP 126/84 | HR 74 | Wt 275.0 lb

## 2013-12-10 DIAGNOSIS — C73 Malignant neoplasm of thyroid gland: Secondary | ICD-10-CM | POA: Diagnosis not present

## 2013-12-10 DIAGNOSIS — E669 Obesity, unspecified: Secondary | ICD-10-CM | POA: Diagnosis not present

## 2013-12-10 DIAGNOSIS — R7309 Other abnormal glucose: Secondary | ICD-10-CM

## 2013-12-10 DIAGNOSIS — E890 Postprocedural hypothyroidism: Secondary | ICD-10-CM | POA: Diagnosis not present

## 2013-12-10 DIAGNOSIS — I10 Essential (primary) hypertension: Secondary | ICD-10-CM

## 2013-12-10 DIAGNOSIS — E0580 Other thyrotoxicosis without thyrotoxic crisis or storm: Secondary | ICD-10-CM

## 2013-12-10 DIAGNOSIS — R7303 Prediabetes: Secondary | ICD-10-CM

## 2013-12-10 LAB — POCT GLYCOSYLATED HEMOGLOBIN (HGB A1C): Hemoglobin A1C: 5

## 2013-12-10 LAB — GLUCOSE, POCT (MANUAL RESULT ENTRY): POC Glucose: 114 mg/dl — AB (ref 70–99)

## 2013-12-10 NOTE — Patient Instructions (Signed)
Follow up visit in 6 months. Please repeat lab tests about one week prior to next appointment.

## 2013-12-10 NOTE — Progress Notes (Signed)
Subjective:  Patient Name: Becky Martinez Date of Birth: Jun 06, 1965  MRN: [id number]  Becky Martinez  presents to the office today for follow up of her papillary thyroid cancer, post-surgical hypothyroidism, iatrogenic hyperthyroidism, morbid obesity, [re-diabetes, ane hypertension.  HISTORY OF PRESENT ILLNESS:   Becky Martinez is a 48 y.o. Caucasian woman.  Symphany was unaccompanied.   1. The patient developed thyroid nodules in 2002. A thyroid ultrasound on 01/03/01 showed a multinodular goiter with a dominant nodule in the right lobe measuring 21 x 21 x 29 mm. On 02/15/01 she underwent a partial thyroidectomy. 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 There was no microscopic evidence of extension of the tumor beyond the capsule. In the left lobe a 0.8 cm focus of papillary thyroid cancer was noted. No lymphovascular invasion or extracapsular extension was noted. An I-131 scan performed on 05/11/01 showed residual uptake in the thyroid bed. On 05/27/01 she received a therapeutic I-131 dose of 40.3 mCi. 3. On 09/18/02 she had a follow up I-131 nuclear scan. This study demonstrated residual activity in the right thyroid bed, compatible with residual functioning thyroid tissue and/or tumor. On 10/01/02 she received a 27 mCi dose of I-131. On 06/26/03 she had a follow up I-131 scan which showed some residual activity in the right thyroid bed area. On 07/01/03 she received 100 mCi of I-131. Follow up I-131 scan on 01/18/04 showed no residual thyroid bed activity.  2. I assumed her care in 2006. Follow up I-131 scan on 01/03/05 showed no residual thyroid bed activity. After ensuring that her TSH was then appropriately suppressed by supraphysiologic doses of Synthroid, I ordered a Thyrogen-stimulated I-131 scan on 01/19/06. This study again showed no evidence of residual thyroid bed activity. Since then I have followed her with serial thyroglobulin  measurements and anti-thyroglobulin antibody measurements. Her thyroglobulin values have consistently been below the lower limit of detection of the assay. Her anti-thyroglobulin antibody values have consistently been below 20.  3. The patient's last [facility] visit was on 07/07/13:  In the interim, she had been healthy. The infected lesions on her left cheek and cervical adenopathy at that visit have resolved. She remains on Synthroid, 300 mcg/day in the mornings and ferrous sulfate in the evenings. She has been trying to lose weight. She eats almost all meals at home now. She is walking more.   PAST MEDICAL, FAMILY, AND SOCIAL HISTORY:  No past medical history on file.  No family history on file.  Current outpatient prescriptions:ferrous sulfate 325 (65 FE) MG tablet, TAKE 1 TABLET BY MOUTH EVERY DAY, Disp: 30 tablet, Rfl: 3;  SYNTHROID 200 MCG tablet, Take 1.5 (359mcg total) by mouth daily, Disp: 45 tablet, Rfl: 4  Allergies as of 12/10/2013  . (No Known Allergies)    1. 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  2. Activities: No physical activity 3. Smoking, alcohol, or drugs: None 4. Primary Care Provider: [facility]. [facility] at [place]: There are no other significant problems involving Ahilyn's other body systems.   Objective:  Vital Signs:  BP 126/84  Pulse 74  Wt 275 lb (124.739 kg)   Ht Readings from Last 3 Encounters:  No data found for Ht   Wt Readings from Last 3 Encounters:  12/10/13 275 lb (124.739 kg)  07/07/13 301 lb 12.8 oz (136.896 kg)  06/09/13 295  lb (133.811 kg)    PHYSICAL EXAM:  Constitutional: The patient appears healthy, but obese. Her skin color is now normally pink.  She has lost 26 pounds since last visit, equivalent to a net loss of about 580 calories per day.  Face: Her face appears normal.  Eyes: There is no obvious arcus or proptosis. Moisture appears normal. Mouth: The  oropharynx and tongue appear normal. Oral moisture is normal. Neck: The neck appears to be visibly normal. No carotid bruits are noted. The thyroid gland is absent. The previous induration of her left strap muscle has resolved. She has no palpable cervical nodes or masses in the thyroid bed. Her supraclavicular areas are clear. She does not have any significant lymphadenopathy.  Lungs: Clear, moves air well Heart: Regular rate and rhythm, Normal S1 and S2 Abdomen; Enlarged, soft, non-tender Hands: No tremor, normal palms Legs: No edema Neuro: 5+ strength in the UEs and LEs. Sensation to touch is intact in both legs.    LAB DATA:  Labs 12/10/13: HbA1c 5.0%  Lab 06/03/13: HbA1c 5.4%; TSH 0.017, free T4 2.81, free T3 4.3, TPO antibody 11.2, thyroglobulin < 0.2, Tg antibody < 20  Lab 01/24/12: TSH 45.646, free T4 0.78, free T3 1.9, thyroglobulin < 0.2, Tg antibody < 20  Lab 11/10/10: TSH 4.896, free T4 1.50, free T3 2.4  Lab 06/26/10: Thyroglobulin < 0.2, thyroglobulin antibody < 20, TSH 0.199, free T4 1.71, free T3 2.7, HbA1c 4.7%   Assessment and Plan:   ASSESSMENT:  1. Thyroid cancer: She has no clinical evidence for recurrence. Her thyroglobulin levels have been below the lower limit of detection of the assay ("unmeasurable") since we began measuring them at her first visit with me in 2006.  It appeared at her last clinic visit that she had probably been cured of thyroid cancer. Her risk of having a recurrence of thyroid cancer is very low. If the thyroglobulin is good today we can safely reduce her level of TSH suppression and her Synthroid dose somewhat.   2. Surgical hypothyroidism/iatrogenic hyperthyroidism: Her TSH was appropriately suppressed in 2012, but very high in 2013. Her TSH was suppressed again at her March visit. .   3. Facial rash: This was due to a subcutaneous skin infection that resolved after topical antibiotics.  4. Obesity: She has lost almost 10% of her body weight.  Three cheers. 6. Hypertension: her BP is better. Walking more and losing fat weight will bring the BP down even more.  7. Pre-diabetes: The weight loss has helped bring her HbA1c down to normal levels.   PLAN:  1. Diagnostic: TFTs and thyroglobulin panel now. Repeat prior to next visit.  2. Therapeutic: Continue Synthroid at current dose for now, but decrease the dose as possible.  3. Patient education: We discussed the issues of thyroid cancer, pre-diabetes, hypertension, and obesity at length.  4. Follow up 6 months  Level of Service: This visit lasted in excess of 40 minutes. More than 50% of the visit was devoted to counseling.  Sherrlyn Hock, MD 12/10/2013 3:14 PM

## 2013-12-11 LAB — THYROGLOBULIN ANTIBODY PANEL
THYROID PEROXIDASE ANTIBODY: 9 [IU]/mL — AB (ref <9)
Thyroglobulin: <0.1 ng/mL — ABNORMAL LOW (ref 2.8–40.9)

## 2013-12-11 LAB — T4, FREE: FREE T4: 2.9 ng/dL — AB (ref 0.80–1.80)

## 2013-12-11 LAB — TSH: TSH: 0.011 u[IU]/mL — ABNORMAL LOW (ref 0.350–4.500)

## 2013-12-11 LAB — T3, FREE: T3, Free: 4.4 pg/mL — ABNORMAL HIGH (ref 2.3–4.2)

## 2013-12-19 ENCOUNTER — Telehealth: Payer: Self-pay | Admitting: *Deleted

## 2013-12-19 ENCOUNTER — Other Ambulatory Visit: Payer: Self-pay | Admitting: *Deleted

## 2013-12-19 DIAGNOSIS — C73 Malignant neoplasm of thyroid gland: Secondary | ICD-10-CM

## 2013-12-19 NOTE — Telephone Encounter (Signed)
Becky Martinez advised that per Dr. Tobe Sos Lab results from 12/10/13 showed that her thyroglobulin is appropriately suppressed to < 0.1 ng/mL. Her anti-thyroglobulin antibody is appropriately low at <1 IU/mL. Her TSH was suppressed to 0.111, free T4 was elevated at 2.90, and free T3 was high-normal at 4.4. Since her thyroglobulin has been quite low for years, her risk of recurrence of her thyroid cancer is relatively low. We can safely reduce her Synthroid doses and allow her TSH to increase to the 0.2-0.4 range. Please change her Synthroid doses to 300 mcg (one 200 mcg pill plus 1/2 of a 200 mcg pill) 5 days per week. Reduce Synthroid on Wednesdays and Sundays to 200 mcg/day. Repeat TFT in 2 months.

## 2014-01-08 ENCOUNTER — Other Ambulatory Visit: Payer: Self-pay | Admitting: "Endocrinology

## 2014-04-06 ENCOUNTER — Other Ambulatory Visit: Payer: Self-pay | Admitting: "Endocrinology

## 2014-04-28 ENCOUNTER — Other Ambulatory Visit: Payer: Self-pay | Admitting: *Deleted

## 2014-04-28 DIAGNOSIS — E669 Obesity, unspecified: Secondary | ICD-10-CM

## 2014-05-17 ENCOUNTER — Other Ambulatory Visit: Payer: Self-pay | Admitting: "Endocrinology

## 2014-06-09 LAB — T3, FREE: T3, Free: 4.4 pg/mL — ABNORMAL HIGH (ref 2.3–4.2)

## 2014-06-09 LAB — THYROGLOBULIN ANTIBODY PANEL
THYROID PEROXIDASE ANTIBODY: 8 [IU]/mL (ref <9)
Thyroglobulin: <0.1 ng/mL — ABNORMAL LOW (ref 2.8–40.9)

## 2014-06-09 LAB — HEMOGLOBIN A1C
HEMOGLOBIN A1C: 5.4 % (ref <5.7)
Mean Plasma Glucose: 108 mg/dL (ref <117)

## 2014-06-09 LAB — TSH

## 2014-06-09 LAB — T4, FREE: FREE T4: 2.83 ng/dL — AB (ref 0.80–1.80)

## 2014-06-10 ENCOUNTER — Encounter: Payer: Self-pay | Admitting: "Endocrinology

## 2014-06-10 ENCOUNTER — Ambulatory Visit (INDEPENDENT_AMBULATORY_CARE_PROVIDER_SITE_OTHER): Payer: BC Managed Care – PPO | Admitting: "Endocrinology

## 2014-06-10 ENCOUNTER — Ambulatory Visit: Payer: BC Managed Care – PPO | Admitting: "Endocrinology

## 2014-06-10 VITALS — BP 125/82 | HR 82 | Wt 290.0 lb

## 2014-06-10 DIAGNOSIS — C73 Malignant neoplasm of thyroid gland: Secondary | ICD-10-CM

## 2014-06-10 DIAGNOSIS — E890 Postprocedural hypothyroidism: Secondary | ICD-10-CM | POA: Diagnosis not present

## 2014-06-10 DIAGNOSIS — R7309 Other abnormal glucose: Secondary | ICD-10-CM

## 2014-06-10 DIAGNOSIS — I10 Essential (primary) hypertension: Secondary | ICD-10-CM

## 2014-06-10 DIAGNOSIS — R7303 Prediabetes: Secondary | ICD-10-CM

## 2014-06-10 NOTE — Progress Notes (Signed)
Subjective:  Patient Name: Becky Martinez Date of Birth: 1965/12/14  MRN: [id number]  Becky Martinez  presents to the office today for follow up of her papillary thyroid cancer, post-surgical hypothyroidism, iatrogenic hyperthyroidism, morbid obesity, [re-diabetes, ane hypertension.  HISTORY OF PRESENT ILLNESS:   Becky Martinez is a 49 y.o. Caucasian woman.  Becky Martinez was unaccompanied.   1. The patient developed thyroid nodules in 2002. A thyroid ultrasound on 01/03/01 showed a multinodular goiter with a dominant nodule in the right lobe measuring 21 x 21 x 29 mm. On 02/15/01 she underwent a partial thyroidectomy. 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 There was no microscopic evidence of extension of the tumor beyond the capsule. In the left lobe a 0.8 cm focus of papillary thyroid cancer was noted. No lymphovascular invasion or extracapsular extension was noted. An I-131 scan performed on 05/11/01 showed residual uptake in the thyroid bed. On 05/27/01 she received a therapeutic I-131 dose of 40.3 mCi. 3. On 09/18/02 she had a follow up I-131 nuclear scan. This study demonstrated residual activity in the right thyroid bed, compatible with residual functioning thyroid tissue and/or tumor. On 10/01/02 she received a 27 mCi dose of I-131. On 06/26/03 she had a follow up I-131 scan which showed some residual activity in the right thyroid bed area. On 07/01/03 she received 100 mCi of I-131. Follow up I-131 scan on 01/18/04 showed no residual thyroid bed activity.  2. I assumed her care in 2006. Follow up I-131 scan on 01/03/05 showed no residual thyroid bed activity. After ensuring that her TSH was then appropriately suppressed by supraphysiologic doses of Synthroid, I ordered a Thyrogen-stimulated I-131 scan on 01/19/06. This study again showed no evidence of residual thyroid bed activity. Since then I have followed her with serial thyroglobulin  measurements and anti-thyroglobulin antibody measurements. Her thyroglobulin values have consistently been below the lower limit of detection of the assay. Her anti-thyroglobulin antibody values have consistently been below 20.  3. The patient's last [facility] visit was on 12/10/13:  In the interim, she had been healthy. She remains on Synthroid, 300 mcg/day in the mornings and ferrous sulfate in the evenings. She has been trying to lose weight. She has resumed eating out a lot. She is not doing any walking.    4. Constitutional. The patient feels good, is healthy overall, and has no significant complaints that pertain to today's visit. Energy: Energy level is good overall. Sleep: The patient usually sleeps well. There are no significant complaints of insomnia, frequent awakening, unusual restlessness, or poor sleep quality.  Body temperature: The patient's body temperature seems to be normal overall. There are no significant problems with being warmer or colder than others in the same environment. Weight: Weight has increased 15 pounds since last visit. There are no significant problems with unusual weight gain or loss. Eyes: The patient's vision is good, but she now needs glasses for reading.There are no signproblems with soreness, bulging, or limited range of eye movements. Neck: The patient is not aware of any problems relating to the anterior neck and thyroid bed. There have been no significant problems swelling, pain, soreness, tenderness, pressure, discomfort, or difficulty swallowing. Heart: The patient feels the expected increase in heart rate during exercise or other physical activities. There have been no significant problems with palpitations, irregular heart beats, chest pain, or chest pressure. Gastrointestinal: Stomach and intestines seem to be working normally. Bbwel movements are normal.  There are no significant complaints of excessive hunger, acid reflux, upset stomach, stomach aches or  pains, diarrhea, or constipation. Musculoskeletal: Muscles and extremities appear to be working normally. There are no significant problems with hand tremor, sweaty palms, palmar erythema, or lower leg swelling. Psychological: Mood and psychologicalal responses seem to be good. There have been no significant problems with sadness, depression, irritability, anger, or inappropriate responses to the actions of others. Mental: The patient has not had any significant problems with abilities to think, to pay attention, to remember, and to make decisions.         PAST MEDICAL, FAMILY, AND SOCIAL HISTORY:  No past medical history on file.  No family history on file.   Current outpatient prescriptions:  .  ferrous sulfate 325 (65 FE) MG tablet, TAKE 1 TABLET BY MOUTH EVERY DAY, Disp: 30 tablet, Rfl: 3 .  SYNTHROID 200 MCG tablet, Take 1.5 (314mcg total) by mouth daily, Disp: 45 tablet, Rfl: 4  Allergies as of 06/10/2014  . (No Known Allergies)    1. 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 She is also taking courses for her master's degree in education. She will finish the program in August. 2. Activities: No physical activity 3. Smoking, alcohol, or drugs: None 4. Primary Care Provider: [facility]. [facility] at [place]: There are no other significant problems involving Becky Martinez's other body systems.   Objective:  Vital Signs:  BP 125/82 mmHg  Pulse 82  Wt 290 lb (131.543 kg)   Ht Readings from Last 3 Encounters:  No data found for Ht   Wt Readings from Last 3 Encounters:  06/10/14 290 lb (131.543 kg)  12/10/13 275 lb (124.739 kg)  07/07/13 301 lb 12.8 oz (136.896 kg)    PHYSICAL EXAM:  Constitutional: The patient appears healthy, but obese. She hs gained 15 pounds in the past 6 months.   Face: Her face appears normal.  Eyes: There is no obvious arcus or proptosis. Moisture appears normal. Mouth: The oropharynx  and tongue appear normal. Oral moisture is normal. Neck: The neck appears to be visibly normal. No carotid bruits are noted. The thyroid gland is absent. The previous induration of her left strap muscle has resolved. She has no palpable cervical nodes or masses in the thyroid bed. Her supraclavicular areas are clear. She does not have any significant lymphadenopathy.  Lungs: Clear, moves air well Heart: Regular rate and rhythm, Normal S1 and S2 Abdomen; Enlarged, soft, non-tender Hands: No tremor, normal palms Legs: No edema Neuro: 5+ strength in the UEs and LEs. Sensation to touch is intact in both legs.    LAB DATA:  Labs 06/08/14; HbA1c 5.4%; TSH < 0.008, free T4 2.83, free T3 4.4, anti-thyroglobuin antibody < 1, thyroglobulin < 0.1  Labs 12/10/13: HbA1c 5.0%  Lab 06/03/13: HbA1c 5.4%; TSH 0.017, free T4 2.81, free T3 4.3, TPO antibody 11.2, thyroglobulin < 0.2, Tg antibody < 20  Lab 01/24/12: TSH 45.646, free T4 0.78, free T3 1.9, thyroglobulin < 0.2, Tg antibody < 20  Lab 11/10/10: TSH 4.896, free T4 1.50, free T3 2.4  Lab 06/26/10: Thyroglobulin < 0.2, thyroglobulin antibody < 20, TSH 0.199, free T4 1.71, free T3 2.7, HbA1c 4.7%   Assessment and Plan:   ASSESSMENT:  1. Thyroid cancer: She has no clinical evidence for recurrence. Her thyroglobulin levels have been below the lower limit of detection of the assay ("unmeasurable") since  we began measuring them at her first visit with me in 2006.  It appears that her thryoid cancer has been cured.  Her risk of having a recurrence of thyroid cancer is very low. Since her thyroglobulin is very good now, we can safely reduce her level of TSH suppression and her Synthroid dose somewhat.   2. Surgical hypothyroidism/iatrogenic hyperthyroidism: Alinda Money is doing well overall.  3. Obesity: She has regained about half the weight she lost last year.  4. Hypertension: her BP is a bit higher today. Walking more and losing fat weight will bring the BP down  even more.  5. Pre-diabetes: Now that her weight has increased, her HbA1c is increasing again, but is still within normal limits.   PLAN:  1. Diagnostic: TFTs and thyroglobulin panel now. Repeat prior to next visit.  2. Therapeutic: Reduce Synthroid to 250 mcg/day. 3. Patient education: We discussed the issues of thyroid cancer, pre-diabetes, hypertension, and obesity at length.  4. Follow up 6 months  Level of Service: This visit lasted in excess of 50 minutes. More than 50% of the visit was devoted to counseling.  Sherrlyn Hock, MD 06/10/2014 4:32 PM

## 2014-06-10 NOTE — Patient Instructions (Signed)
Follow up visit in 6 months. Please have lab tests done one week prior to next visit.

## 2014-07-11 ENCOUNTER — Other Ambulatory Visit: Payer: Self-pay | Admitting: "Endocrinology

## 2014-09-04 ENCOUNTER — Other Ambulatory Visit: Payer: Self-pay | Admitting: "Endocrinology

## 2014-12-15 ENCOUNTER — Encounter: Payer: Self-pay | Admitting: "Endocrinology

## 2014-12-15 ENCOUNTER — Ambulatory Visit (INDEPENDENT_AMBULATORY_CARE_PROVIDER_SITE_OTHER): Payer: BC Managed Care – PPO | Admitting: "Endocrinology

## 2014-12-15 VITALS — BP 114/72 | HR 78 | Wt 286.0 lb

## 2014-12-15 DIAGNOSIS — R7309 Other abnormal glucose: Secondary | ICD-10-CM

## 2014-12-15 DIAGNOSIS — C73 Malignant neoplasm of thyroid gland: Secondary | ICD-10-CM

## 2014-12-15 DIAGNOSIS — R7303 Prediabetes: Secondary | ICD-10-CM

## 2014-12-15 DIAGNOSIS — E890 Postprocedural hypothyroidism: Secondary | ICD-10-CM | POA: Diagnosis not present

## 2014-12-15 DIAGNOSIS — I10 Essential (primary) hypertension: Secondary | ICD-10-CM | POA: Diagnosis not present

## 2014-12-15 DIAGNOSIS — E669 Obesity, unspecified: Secondary | ICD-10-CM | POA: Diagnosis not present

## 2014-12-15 LAB — TSH: TSH: <0.008 u[IU]/mL — ABNORMAL LOW (ref 0.350–4.500)

## 2014-12-15 LAB — T4, FREE: Free T4: 2.13 ng/dL — ABNORMAL HIGH (ref 0.80–1.80)

## 2014-12-15 LAB — GLUCOSE, POCT (MANUAL RESULT ENTRY): POC Glucose: 114 mg/dl — AB (ref 70–99)

## 2014-12-15 LAB — POCT GLYCOSYLATED HEMOGLOBIN (HGB A1C): Hemoglobin A1C: 4.9

## 2014-12-15 LAB — T3, FREE: T3 FREE: 3.8 pg/mL (ref 2.3–4.2)

## 2014-12-15 NOTE — Progress Notes (Signed)
Subjective:  Patient Name: Becky Martinez Date of Birth: 02-Feb-1966  MRN: [id number]  Becky Martinez  presents to the office today for follow up of her papillary thyroid cancer, post-surgical hypothyroidism, iatrogenic hyperthyroidism, morbid obesity,pre-diabetes, and hypertension.  HISTORY OF PRESENT ILLNESS:   Becky Martinez is a 49 y.o. Caucasian woman.  Becky Martinez was unaccompanied.   1. The patient developed thyroid nodules in 2002. A thyroid ultrasound on 01/03/01 showed a multinodular goiter with a dominant nodule in the right lobe measuring 21 x 21 x 29 mm. On 02/15/01 she underwent a partial thyroidectomy. 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 There was no microscopic evidence of extension of the tumor beyond the capsule. In the left lobe a 0.8 cm focus of papillary thyroid cancer was noted. No lymphovascular invasion or extracapsular extension was noted. An I-131 scan performed on 05/11/01 showed residual uptake in the thyroid bed. On 05/27/01 she received a therapeutic I-131 dose of 40.3 mCi. 3. On 09/18/02 she had a follow up I-131 nuclear scan. This study demonstrated residual activity in the right thyroid bed, compatible with residual functioning thyroid tissue and/or tumor. On 10/01/02 she received a 27 mCi dose of I-131. On 06/26/03 she had a follow up I-131 scan which showed some residual activity in the right thyroid bed area. On 07/01/03 she received 100 mCi of I-131. Follow up I-131 scan on 01/18/04 showed no residual thyroid bed activity.  2. I assumed her care in 2006. Follow up I-131 scan on 01/03/05 showed no residual thyroid bed activity. After ensuring that her TSH was then appropriately suppressed by supraphysiologic doses of Synthroid, I ordered a Thyrogen-stimulated I-131 scan on 01/19/06. This study again showed no evidence of residual thyroid bed activity. Since then I have followed her with serial thyroglobulin  measurements and anti-thyroglobulin antibody measurements. Her thyroglobulin values have consistently been below the lower limit of detection of the assay. Her anti-thyroglobulin antibody values have consistently been below 20.  3. The patient's last [facility] visit was on 06/10/14:  At that visit I reduced her Synthroid dose to 250 mcg/day in order to decrease her Synthroid suppression of her endogenous TSH production. In the interim, she has been healthy. She remains on Synthroid, 250 mcg/day in the mornings and ferrous sulfate in the evenings. She has been trying to lose weight by trying harder to control her eating. She is not yet doing any walking.  Life is less stressful since completing her master's degree program last month.  4. Constitutional. The patient feels good, is healthy overall, and has no significant complaints that pertain to today's visit. Energy: Energy level is "fine".  Sleep: The patient usually sleeps well. There are no significant complaints of insomnia, frequent awakening, unusual restlessness, or poor sleep quality.  Body temperature: The patient's body temperature seems to be normal overall. There are no significant problems with being warmer or colder than others in the same environment. Weight: Weight has decreased 4 pounds in the past month.  Eyes: The patient's vision is good, but she still needs glasses for reading.There are no significant problems with soreness, bulging, or limited range of eye movements. Neck: The patient is not aware of any problems relating to the anterior neck and thyroid bed. There have been no significant problems swelling, pain, soreness, tenderness, pressure, discomfort, or difficulty swallowing. Heart: The patient feels the expected increase in heart rate during exercise or other physical activities. There have been  no significant problems with palpitations, irregular heart beats, chest pain, or chest pressure. Gastrointestinal: Stomach and intestines  seem to be working normally. Bowel movements are normal. There are no significant complaints of excessive hunger, acid reflux, upset stomach, stomach aches or pains, diarrhea, or constipation. Musculoskeletal: She occasionally feels discomfort in her left middle finger on the palmar side when she is premenstrual. . Muscles and extremities appear to be working normally. There are no significant problems with hand tremor, sweaty palms, palmar erythema, or lower leg swelling. Psychological: Mood and psychological responses seem to be good. There have been no significant problems with sadness, depression, irritability, anger, or inappropriate responses to the actions of others. Mental: The patient has not had any significant problems with abilities to think, to pay attention, to remember, and to make decisions GYN: LMP was 9/16/216. Periods are regular.      PAST MEDICAL, FAMILY, AND SOCIAL HISTORY:  No past medical history on file.  No family history on file.   Current outpatient prescriptions:  .  ferrous sulfate 325 (65 FE) MG tablet, TAKE 1 TABLET BY MOUTH EVERY DAY, Disp: 30 tablet, Rfl: 3 .  SYNTHROID 200 MCG tablet, Take 1.5 (355mcg total) by mouth daily, Disp: 45 tablet, Rfl: 4  Allergies as of 12/15/2014  . (No Known Allergies)    1. 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  2. Activities: No physical activity 3. Smoking, alcohol, or drugs: None 4. Primary Care Provider: [facility]. [facility] [place]: There are no other significant problems involving Becky Martinez's other body systems.   Objective:  Vital Signs:  BP 114/72 mmHg  Pulse 78  Wt 286 lb (129.729 kg)   Ht Readings from Last 3 Encounters:  No data found for Ht   Wt Readings from Last 3  Encounters:  12/15/14 286 lb (129.729 kg)  06/10/14 290 lb (131.543 kg)  12/10/13 275 lb (124.739 kg)    PHYSICAL EXAM:  Constitutional: The patient appears healthy, but obese. She has lost 4 pounds in the past 6 months.  She is bright and alert today. She no longer looks excessively tired.  Face: Her face appears normal.  Eyes: There is no obvious arcus or proptosis. Moisture appears normal. Mouth: The oropharynx and tongue appear normal. Oral moisture is normal. Neck: The neck appears to be visibly normal. No carotid bruits are noted. The thyroid gland is absent. The previous induration of her left strap muscle has resolved. She has no palpable cervical nodes or masses in the thyroid bed. Her supraclavicular areas are clear. She does not have any significant lymphadenopathy.  Lungs: Clear, moves air well Heart: Regular rate and rhythm, Normal S1 and S2 Abdomen; Enlarged, soft, non-tender Hands: No tremor, normal palms, no numbness to touch in her 1st, 2nd, 3rd, and 4th fingers Legs: No edema Neuro: 5+ strength in the UEs and LEs. Sensation to touch is intact in both legs.    LAB DATA:  Labs 12/15/14: HbA1c 4.9%; other labs pending  Labs 06/08/14; HbA1c 5.4%; TSH < 0.008, free T4 2.83, free T3 4.4, anti-thyroglobulin antibody < 1, thyroglobulin < 0.1  Labs 12/10/13: HbA1c 5.0%  Lab 06/03/13: HbA1c 5.4%; TSH 0.017, free T4 2.81, free T3 4.3, TPO antibody 11.2, thyroglobulin < 0.2,  Tg antibody < 20  Lab 01/24/12: TSH 45.646, free T4 0.78, free T3 1.9, thyroglobulin < 0.2, Tg antibody < 20  Lab 11/10/10: TSH 4.896, free T4 1.50, free T3 2.4  Lab 06/26/10: Thyroglobulin < 0.2, thyroglobulin antibody < 20, TSH 0.199, free T4 1.71, free T3 2.7, HbA1c 4.7%   Assessment and Plan:   ASSESSMENT:  1. Thyroid cancer: She has no clinical evidence for recurrence of thyroid cancer. Her thyroglobulin levels through March 2016 have been below the lower limit of detection of the assay  ("unmeasurable") since we began measuring them at her first visit with me in 2006.  It appears that her thyroid cancer has been cured.  Her risk of having a recurrence of thyroid cancer is very, very low. Since her thyroglobulin was very good in March, I reduced the Synthroid dose to 250 mcg/day in order to reduce her level of TSH suppression.   2. Surgical hypothyroidism/iatrogenic hyperthyroidism: She is doing well overall.  3. Obesity: She has lost 4 pounds,which is a great start.    4. Hypertension: Her BP is good today. Walking more and losing fat weight will bring the BP down even more.  5. Pre-diabetes: Now that she has been more careful with her diet and her weight has decreased, her HbA1c is decreasing again.   PLAN:  1. Diagnostic: TFTs and thyroglobulin panel now. Repeat prior to next visit.  2. Therapeutic: Continue Synthroid to 250 mcg/day for now, but adjust the doses as needed to gradually allow the TSH to rise to the 0.5 level.  3. Patient education: We discussed the issues of thyroid cancer, pre-diabetes, hypertension, and obesity at length.  4. Follow up 6 months  Level of Service: This visit lasted in excess of 50 minutes. More than 50% of the visit was devoted to counseling.  Sherrlyn Hock, MD 12/15/2014 2:30 PM

## 2014-12-15 NOTE — Patient Instructions (Signed)
Follow up visit in 6 months. Please repeat lab tests one week prior.  

## 2014-12-16 LAB — THYROGLOBULIN ANTIBODY PANEL: Thyroperoxidase Ab SerPl-aCnc: 10 IU/mL — ABNORMAL HIGH (ref <9)

## 2014-12-24 ENCOUNTER — Other Ambulatory Visit: Payer: Self-pay | Admitting: *Deleted

## 2014-12-24 ENCOUNTER — Telehealth: Payer: Self-pay | Admitting: *Deleted

## 2014-12-24 DIAGNOSIS — E890 Postprocedural hypothyroidism: Secondary | ICD-10-CM

## 2014-12-24 NOTE — Telephone Encounter (Signed)
Patient returned Lorenas call, I advised that per Dr. Darene Lamer is again so low that it can't be measured. Thyroglobulin antibody is similarly low. There is no chemical evidence of thyroid cancer recurrence. 2. TFTs are still hyperthyroid. Please reduce Synthroid dose to 200 mcg/day. Please repeat TFTs in 2 months. Please also repeat TFTS and thyroglobulin panel 2 weeks prior to next visit.

## 2015-01-19 ENCOUNTER — Other Ambulatory Visit: Payer: Self-pay | Admitting: "Endocrinology

## 2015-04-03 ENCOUNTER — Other Ambulatory Visit: Payer: Self-pay | Admitting: "Endocrinology

## 2015-04-14 ENCOUNTER — Other Ambulatory Visit: Payer: Self-pay | Admitting: *Deleted

## 2015-04-14 DIAGNOSIS — E034 Atrophy of thyroid (acquired): Secondary | ICD-10-CM

## 2015-05-13 ENCOUNTER — Other Ambulatory Visit: Payer: Self-pay | Admitting: *Deleted

## 2015-05-13 DIAGNOSIS — E611 Iron deficiency: Secondary | ICD-10-CM

## 2015-05-13 MED ORDER — FERROUS SULFATE 325 (65 FE) MG PO TABS: 325.0000 mg | ORAL_TABLET | Freq: Every day | ORAL | Status: DC

## 2015-05-29 LAB — TSH: TSH: 0.01 mIU/L — ABNORMAL LOW

## 2015-05-29 LAB — T4, FREE: Free T4: 3.4 ng/dL — ABNORMAL HIGH (ref 0.8–1.8)

## 2015-05-29 LAB — T3, FREE: T3, Free: 5.4 pg/mL — ABNORMAL HIGH (ref 2.3–4.2)

## 2015-05-30 LAB — HEMOGLOBIN A1C
HEMOGLOBIN A1C: 5.3 % (ref <5.7)
MEAN PLASMA GLUCOSE: 105 mg/dL (ref <117)

## 2015-05-31 ENCOUNTER — Other Ambulatory Visit: Payer: Self-pay

## 2015-05-31 DIAGNOSIS — Z1231 Encounter for screening mammogram for malignant neoplasm of breast: Secondary | ICD-10-CM

## 2015-05-31 LAB — THYROGLOBULIN ANTIBODY PANEL
THYROID PEROXIDASE ANTIBODY: 7 [IU]/mL (ref <9)
Thyroglobulin Ab: <1 IU/mL (ref <2)
Thyroglobulin: <0.1 ng/mL — ABNORMAL LOW (ref 2.8–40.9)

## 2015-06-14 ENCOUNTER — Encounter: Payer: Self-pay | Admitting: "Endocrinology

## 2015-06-14 ENCOUNTER — Ambulatory Visit (INDEPENDENT_AMBULATORY_CARE_PROVIDER_SITE_OTHER): Payer: BC Managed Care – PPO | Admitting: "Endocrinology

## 2015-06-14 VITALS — BP 111/73 | HR 69 | Wt 287.0 lb

## 2015-06-14 DIAGNOSIS — C73 Malignant neoplasm of thyroid gland: Secondary | ICD-10-CM | POA: Diagnosis not present

## 2015-06-14 DIAGNOSIS — E890 Postprocedural hypothyroidism: Secondary | ICD-10-CM

## 2015-06-14 DIAGNOSIS — R7303 Prediabetes: Secondary | ICD-10-CM

## 2015-06-14 DIAGNOSIS — I10 Essential (primary) hypertension: Secondary | ICD-10-CM

## 2015-06-14 DIAGNOSIS — E0580 Other thyrotoxicosis without thyrotoxic crisis or storm: Secondary | ICD-10-CM

## 2015-06-14 MED ORDER — SYNTHROID 125 MCG PO TABS: ORAL_TABLET | ORAL | Status: DC

## 2015-06-14 NOTE — Patient Instructions (Signed)
Follow up visit in 6 months. Please repeat thyroid blood tests 2 weeks prior. To use up the 200 mcg Synthroid pills, take one 200 mcg tablet diaiy. Take 1/2 of a 200 mcg tablet three times per week.

## 2015-06-14 NOTE — Progress Notes (Signed)
Subjective:  Patient Name: Becky Martinez Date of Birth: [birth year]  MRN: [id number]  Becky Martinez  presents to the office today for follow up of her papillary thyroid cancer, post-surgical hypothyroidism, iatrogenic hyperthyroidism, morbid obesity, pre-diabetes, and hypertension.  HISTORY OF PRESENT ILLNESS:   Becky Martinez is a 50 y.o. Caucasian woman.  Becky Martinez was unaccompanied.   1. The patient developed thyroid nodules in 2002. A thyroid ultrasound on 01/03/01 showed a multinodular goiter with a dominant nodule in the right lobe measuring 21 x 21 x 29 mm. On 02/15/01 she underwent a partial thyroidectomy. 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 There was no microscopic evidence of extension of the tumor beyond the capsule. In the left lobe a 0.8 cm focus of papillary thyroid cancer was noted. No lymphovascular invasion or extracapsular extension was noted. An I-131 scan performed on 05/11/01 showed residual uptake in the thyroid bed. On 05/27/01 she received a therapeutic I-131 dose of 40.3 mCi. 3. On 09/18/02 she had a follow up I-131 nuclear scan. This study demonstrated residual activity in the right thyroid bed, compatible with residual functioning thyroid tissue and/or tumor. On 10/01/02 she received a 27 mCi dose of I-131. On 06/26/03 she had a follow up I-131 scan which showed some residual activity in the right thyroid bed area. On 07/01/03 she received 100 mCi of I-131. Follow up I-131 scan on 01/18/04 showed no residual thyroid bed activity.  2. I assumed her care in 2006. Follow up I-131 scan on 01/03/05 showed no residual thyroid bed activity. After ensuring that her TSH was then appropriately suppressed by supraphysiologic doses of Synthroid, I ordered a Thyrogen-stimulated I-131 scan on 01/19/06. This study again showed no evidence of residual thyroid bed activity. Since then I have followed her with serial thyroglobulin  measurements and anti-thyroglobulin antibody measurements. Her thyroglobulin values have consistently been below the lower limit of detection of the assay. Her anti-thyroglobulin antibody values have consistently been below 20.  3. The patient's last [facility] visit was on 12/15/14:  At that visit I continued her Synthroid dose of 250 mcg/day in order to decrease her Synthroid suppression of her endogenous TSH production. Today, however, I discovered that she has actually been taking 1.5 of the 200 mcg pills per day = 300 mg/day. In the interim, she has been healthy. She remains on Synthroid, 250 mcg/day in the mornings and ferrous sulfate in the evenings. She has been trying to lose weight by trying harder to control her eating. She is not yet doing any walking.  Life is a lot less stressful since completing her master's degree program last Fall.   3.. Constitutional. The patient feels good, is healthy overall, and has no significant complaints that pertain to today's visit. She has been hoarse for about 3 weeks. She has not had any allergy symptoms  Energy: Energy level is "fine".  Sleep: The patient usually sleeps well. There are no significant complaints of insomnia, frequent awakening, unusual restlessness, or poor sleep quality.  Body temperature: The patient's body temperature seems to be normal overall. There are no significant problems with being warmer or colder than others in the same environment. Weight: Weight has increased 1 pound in the past month. Her cloths are fitting more loosely.  Eyes: The patient's vision is good, but she still needs glasses for reading.There are no significant problems with soreness, bulging, or limited range of eye movements. Neck: The patient is  not aware of any problems relating to the anterior neck and thyroid bed. There have been no significant problems swelling, pain, soreness, tenderness, pressure, discomfort, or difficulty swallowing. Heart: The patient feels the  expected increase in heart rate during exercise or other physical activities. There have been no significant problems with palpitations, irregular heart beats, chest pain, or chest pressure. Gastrointestinal: Stomach and intestines seem to be working normally. Bowel movements are normal. There are no significant complaints of excessive hunger, acid reflux, upset stomach, stomach aches or pains, diarrhea, or constipation. Musculoskeletal: Muscles and extremities appear to be working normally. There are no significant problems with hand tremor, sweaty palms, palmar erythema, or lower leg swelling. Psychological: "I'm fine".  Mental: The patient has not had any significant problems with abilities to think, to pay attention, to remember, and to make decisions GYN: LMP is now. Periods are regular.      PAST MEDICAL, FAMILY, AND SOCIAL HISTORY:  No past medical history on file.  No family history on file.   Current outpatient prescriptions:  .  ferrous sulfate 325 (65 FE) MG tablet, Take 1 tablet (325 mg total) by mouth daily., Disp: 90 tablet, Rfl: 4 .  SYNTHROID 200 MCG tablet, Take 1.5 (374mcg total) by mouth daily, Disp: 45 tablet, Rfl: 4  Allergies as of 06/14/2015  . (No Known Allergies)    1. 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  2. Activities: No physical activity 3. Smoking, alcohol, or drugs: None 4. Primary Care Provider: [facility]. [facility] [place]: There are no other significant problems involving Becky Martinez's other body systems.   Objective:  Vital Signs:  BP 111/73 mmHg  Pulse 69  Wt 287 lb (130.182 kg)   Ht Readings from Last 3 Encounters:  No data found for Ht   Wt Readings from Last 3 Encounters:  06/14/15 287 lb (130.182 kg)  12/15/14  286 lb (129.729 kg)  06/10/14 290 lb (131.543 kg)    PHYSICAL EXAM:  Constitutional: The patient appears healthy, but obese. She has gained one pound in the past 6 months, but looks a bit trimmer.  She is bright and alert today. She no longer looks excessively tired.  Face: Her face appears normal.  Eyes: There is no obvious arcus or proptosis. Moisture appears normal. Mouth: The oropharynx and tongue appear normal. Oral moisture is normal. Neck: The neck appears to be visibly normal. No carotid bruits are noted. The thyroid gland is absent. The previous induration of her left strap muscle has resolved. She has no palpable cervical nodes or masses in the thyroid bed. Her supraclavicular areas are clear. She does not have any significant lymphadenopathy.  Lungs: Clear, moves air well Heart: Regular rate and rhythm, Normal S1 and S2 Abdomen; Enlarged, soft, non-tender Hands: No tremor, normal palms, no numbness to touch in her 1st, 2nd, 3rd, and 4th fingers Legs: No edema Neuro: 5+ strength in the UEs and LEs. Sensation to touch is intact in both legs.    LAB DATA:  Labs 05/29/15: HbA1c 5.3%; TSH 0.01, free T4 3.4, free T3 5.4, thyroglobulin <0.1, thyroglobulin antibody <1, TPO antibody 7  Labs 12/15/14: HbA1c 4.9%; other labs pending  Labs 06/08/14; HbA1c 5.4%; TSH < 0.008, free T4 2.83, free  T3 4.4, anti-thyroglobulin antibody < 1, thyroglobulin < 0.1  Labs 12/10/13: HbA1c 5.0%  Lab 06/03/13: HbA1c 5.4%; TSH 0.017, free T4 2.81, free T3 4.3, TPO antibody 11.2, thyroglobulin < 0.2, Tg antibody < 20  Lab 01/24/12: TSH 45.646, free T4 0.78, free T3 1.9, thyroglobulin < 0.2, Tg antibody < 20  Lab 11/10/10: TSH 4.896, free T4 1.50, free T3 2.4  Lab 06/26/10: Thyroglobulin < 0.2, thyroglobulin antibody < 20, TSH 0.199, free T4 1.71, free T3 2.7, HbA1c 4.7%   Assessment and Plan:   ASSESSMENT:  1. Thyroid cancer: She has no clinical evidence for recurrence of thyroid cancer. Her  thyroglobulin levels through the present have been below the lower limit of detection of the assay ("unmeasurable") since we began measuring them at her first visit with me in 2006.  It appears that her thyroid cancer has been cured.  Her risk of having a recurrence of thyroid cancer is very, very low. Since her thyroglobulin was very good in March, I intended to reduce her Synthroid dose to 250 mcg/day in order to reduce her level of TSH suppression. Unfortunately, her T4 and T3 levels are higher due to her being on the higher dose.  I will order a reduction in her synthroid dose today.  2-3. Surgical hypothyroidism/iatrogenic hyperthyroidism: She is doing well overall.  4. Obesity: She has regained one pound,which may represent muscle.     5. Hypertension: Her BP is good today. Walking more and losing fat weight will bring the BP down even more.  6. Pre-diabetes: Her HbA1c is higher than in September, but still within normal limits. She needs to be even more careful with her diet. Exercise would help even more.    PLAN:  1. Diagnostic: TFTs prior to next visit. Thyroglobulin panel in one year. 2. Therapeutic: Reduce Synthroid to 250 mcg/day (two of the 125 mcg tablets per day.) for now, but adjust the doses as needed to gradually allow the TSH to rise to the 0.5 level.  3. Patient education: We discussed the issues of thyroid cancer, pre-diabetes, hypertension, and obesity at length.  4. Follow up 6 months  Level of Service: This visit lasted in excess of 50 minutes. More than 50% of the visit was devoted to counseling.  Sherrlyn Hock, MD 06/14/2015 3:02 PM

## 2015-06-15 ENCOUNTER — Ambulatory Visit
Admission: RE | Admit: 2015-06-15 | Discharge: 2015-06-15 | Disposition: A | Discharge status: Home / Self Care | Payer: BC Managed Care – PPO | Source: Ambulatory Visit

## 2015-06-15 DIAGNOSIS — Z1231 Encounter for screening mammogram for malignant neoplasm of breast: Secondary | ICD-10-CM

## 2015-07-23 ENCOUNTER — Other Ambulatory Visit: Payer: Self-pay | Admitting: Family Medicine

## 2015-07-23 ENCOUNTER — Other Ambulatory Visit (HOSPITAL_COMMUNITY)
Admission: RE | Admit: 2015-07-23 | Discharge: 2015-07-23 | Disposition: A | Discharge status: Home / Self Care | Payer: BC Managed Care – PPO | Source: Ambulatory Visit | Attending: Family Medicine | Admitting: Family Medicine

## 2015-07-23 DIAGNOSIS — Z01419 Encounter for gynecological examination (general) (routine) without abnormal findings: Secondary | ICD-10-CM | POA: Insufficient documentation

## 2015-07-27 LAB — CYTOLOGY - PAP

## 2015-08-31 ENCOUNTER — Telehealth: Payer: Self-pay | Admitting: "Endocrinology

## 2015-09-01 ENCOUNTER — Other Ambulatory Visit: Payer: Self-pay | Admitting: *Deleted

## 2015-09-01 DIAGNOSIS — E611 Iron deficiency: Secondary | ICD-10-CM

## 2015-09-01 MED ORDER — FERROUS SULFATE 325 (65 FE) MG PO TABS: 325.0000 mg | ORAL_TABLET | Freq: Every day | ORAL | Status: DC

## 2015-09-01 NOTE — Telephone Encounter (Signed)
Script sent  

## 2015-12-09 ENCOUNTER — Other Ambulatory Visit: Payer: Self-pay | Admitting: *Deleted

## 2015-12-09 DIAGNOSIS — E034 Atrophy of thyroid (acquired): Secondary | ICD-10-CM

## 2015-12-09 LAB — T4, FREE: Free T4: 2.4 ng/dL — ABNORMAL HIGH (ref 0.8–1.8)

## 2015-12-09 LAB — T3, FREE: T3 FREE: 3.5 pg/mL (ref 2.3–4.2)

## 2015-12-09 LAB — TSH: TSH: 0.01 m[IU]/L — AB

## 2015-12-10 LAB — HEMOGLOBIN A1C
HEMOGLOBIN A1C: 4.9 % (ref <5.7)
Mean Plasma Glucose: 94 mg/dL

## 2015-12-15 ENCOUNTER — Ambulatory Visit (INDEPENDENT_AMBULATORY_CARE_PROVIDER_SITE_OTHER): Payer: BC Managed Care – PPO | Admitting: "Endocrinology

## 2015-12-15 VITALS — BP 112/78 | HR 69 | Wt 250.0 lb

## 2015-12-15 DIAGNOSIS — E890 Postprocedural hypothyroidism: Secondary | ICD-10-CM

## 2015-12-15 DIAGNOSIS — C73 Malignant neoplasm of thyroid gland: Secondary | ICD-10-CM | POA: Diagnosis not present

## 2015-12-15 DIAGNOSIS — E0580 Other thyrotoxicosis without thyrotoxic crisis or storm: Secondary | ICD-10-CM

## 2015-12-15 DIAGNOSIS — Z23 Encounter for immunization: Secondary | ICD-10-CM

## 2015-12-15 DIAGNOSIS — R7303 Prediabetes: Secondary | ICD-10-CM

## 2015-12-15 DIAGNOSIS — I10 Essential (primary) hypertension: Secondary | ICD-10-CM

## 2015-12-15 NOTE — Patient Instructions (Signed)
Follow up visit in 6 months. Please take two of the 125 mcg Synthroid tablets per day for 6 days each week, but take only one pill on Sundays. Please repeat thyroid tests in December.

## 2015-12-15 NOTE — Progress Notes (Signed)
Subjective:  Patient Name: Becky Martinez Date of Birth: September 03, 1965  MRN: [id number]  Becky Martinez  presents to the office today for follow up of her papillary thyroid cancer, post-surgical hypothyroidism, iatrogenic hyperthyroidism, morbid obesity, pre-diabetes, and hypertension.  HISTORY OF PRESENT ILLNESS:   Becky Martinez is a 50 y.o. Caucasian woman.  Becky Martinez was unaccompanied.   1. The patient developed thyroid nodules in 2002. A thyroid ultrasound on 01/03/01 showed a multinodular goiter with a dominant nodule in the right lobe measuring 21 x 21 x 29 mm. On 02/15/01 she underwent a partial thyroidectomy. 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 There was no microscopic evidence of extension of the tumor beyond the capsule. In the left lobe a 0.8 cm focus of papillary thyroid cancer was noted. No lymphovascular invasion or extracapsular extension was noted. An I-131 scan performed on 05/11/01 showed residual uptake in the thyroid bed. On 05/27/01 she received a therapeutic I-131 dose of 40.3 mCi. 3. On 09/18/02 she had a follow up I-131 nuclear scan. This study demonstrated residual activity in the right thyroid bed, compatible with residual functioning thyroid tissue and/or tumor. On 10/01/02 she received a 27 mCi dose of I-131. On 06/26/03 she had a follow up I-131 scan which showed some residual activity in the right thyroid bed area. On 07/01/03 she received 100 mCi of I-131. Follow up I-131 scan on 01/18/04 showed no residual thyroid bed activity.  2. I assumed her care in 2006. Follow up I-131 scan on 01/03/05 showed no residual thyroid bed activity. After ensuring that her TSH was then appropriately suppressed by supraphysiologic doses of Synthroid, I ordered a Thyrogen-stimulated I-131 scan on 01/19/06. This study again showed no evidence of residual thyroid bed activity. Since then I have followed her with serial thyroglobulin  measurements and anti-thyroglobulin antibody measurements. Her thyroglobulin values have consistently been below the lower limit of detection of the assay. Her anti-thyroglobulin antibody values have consistently been below 20.  3. The patient's last [facility] visit was on 06/14/15:    A. At that visit I continued her Synthroid dose of 250 mcg/day in order to decrease her Synthroid suppression of her endogenous TSH production  B. In the interim she has been healthy.   C. She is exercising every day and keeping her calorie intake less than 1000 per day. Her husband is working with her.   3.. Constitutional. The patient feels good and is healthy overall. Her only complaint is that her hair is falling out. Energy level is "fine". The patient usually sleeps well, but has been dreaming more. She is taking in less caffeine. She does snore at times.  There are no significant problems with being warmer or colder than others in the same environment. Her weight has decreased 37 pound in the past month. Her clothes are fitting more loosely.  Eyes: The patient's vision is slowly changing, c/w presbyopia. She needs glasses for reading. There are no significant problems with soreness, bulging, or limited range of eye movements. Neck: The patient is not aware of any problems relating to the anterior neck and thyroid bed. There have been no significant problems swelling, pain, soreness, tenderness, pressure, discomfort, or difficulty swallowing. Heart: The patient feels the expected increase in heart rate during exercise or other physical activities. There have been no significant problems with palpitations, irregular heart beats, chest pain, or chest pressure. Gastrointestinal: Stomach and intestines seem to be working normally.  Bowel movements are normal. There are no significant complaints of excessive hunger, acid reflux, upset stomach, stomach aches or pains, diarrhea, or constipation. Musculoskeletal: Muscles and  extremities appear to be working normally. There are no significant problems with hand tremor, sweaty palms, palmar erythema, or lower leg swelling. Psychological: "fine".  Mental: The patient is doing quite well in terms of her abilities to think, to pay attention, to remember, and to make decisions GYN: LMP was several weeks ago. Periods are regular.      PAST MEDICAL, FAMILY, AND SOCIAL HISTORY:  No past medical history on file.  No family history on file.   Current Outpatient Prescriptions:  .  ferrous sulfate 325 (65 FE) MG tablet, Take 1 tablet (325 mg total) by mouth daily., Disp: 90 tablet, Rfl: 4 .  SYNTHROID 125 MCG tablet, Take two brand synthroid 125 mcg tablets daily., Disp: 60 tablet, Rfl: 12  Allergies as of 12/15/2015  . (No Known Allergies)    1. 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 She finished her master's degree in education in August, so now has more time to herself.  2. Activities: Daily exercise 3. Smoking, alcohol, or drugs: None 4. Primary Care Provider: Dr. [facility]: There are no other significant problems involving Becky Martinez's other body systems.   Objective:  Vital Signs:  BP 112/78   Pulse 69   Wt 250 lb (113.4 kg)    Ht Readings from Last 3 Encounters:  No data found for Ht   Wt Readings from Last 3 Encounters:  12/15/15 250 lb (113.4 kg)  06/14/15 287 lb (130.2 kg)  12/15/14 286 lb (129.7 kg)    PHYSICAL EXAM:  Constitutional: The patient appears healthy, slimmer, but still obese. She has lost 37 pounds since her last visit.   She is bright and alert today. She no longer looks excessively tired.  Face: Her face appears normal.  Eyes: There is no obvious arcus or proptosis. Moisture appears normal. Mouth: The oropharynx and tongue appear normal. Oral moisture is normal. Neck: The neck appears to be visibly normal. No carotid bruits are noted. The  thyroid gland is absent. The previous induration of her left strap muscle has resolved. She has no palpable cervical nodes or masses in the thyroid bed. Her supraclavicular areas are clear. She does not have any significant lymphadenopathy.  Lungs: Clear, moves air well Heart: Regular rate and rhythm, Normal S1 and S2 Abdomen; Enlarged, soft, non-tender Hands: No tremor, normal palms, no numbness to touch in her 1st, 2nd, 3rd, and 4th fingers Legs: No edema Neuro: 5+ strength in the UEs and LEs. Sensation to touch is intact in both legs.    LAB DATA:  Labs 12/09/15: HbA1c 4.9%; TSH 0.01, free T4 2.4, free T3 3.5  Labs 05/29/15: HbA1c 5.3%; TSH 0.01, free T4 3.4, free T3 5.4, thyroglobulin <0.1, thyroglobulin antibody <1, TPO antibody 7  Labs 12/15/14: HbA1c 4.9%; other labs pending  Labs 06/08/14; HbA1c 5.4%; TSH < 0.008, free T4 2.83, free T3 4.4, thyroglobulin <0.1, anti-thyroglobulin antibody <1  Labs 12/10/13: HbA1c 5.0%  Lab 06/03/13: HbA1c 5.4%; TSH 0.017, free T4 2.81, free T3 4.3, TPO antibody 11.2, thyroglobulin < 0.2, Tg antibody < 20  Lab 01/24/12: TSH 45.646, free T4 0.78, free T3 1.9, thyroglobulin < 0.2, Tg antibody < 20  Lab 11/10/10: TSH 4.896, free T4 1.50, free T3 2.4  Lab 06/26/10:  Thyroglobulin < 0.2, thyroglobulin antibody < 20, TSH 0.199, free T4 1.71, free T3 2.7, HbA1c 4.7%   Assessment and Plan:   ASSESSMENT:  1. Thyroid cancer:   A. She has no clinical evidence for recurrence of thyroid cancer. Her thyroglobulin levels through the present have been below the lower limit of detection of the assay ("unmeasurable") since we began measuring them at her first visit with me in 2006.  It appears that her thyroid cancer has been cured.  Her risk of having a recurrence of thyroid cancer is very, very low.   B. Since her thyroglobulin was very good in March, I reduce her Synthroid dose to 250 mcg/day in order to reduce her level of TSH suppression. Since then her free T4 is  lower, but still elevated. Her free T3 is also lower and is now within normal limits for age. Her TSH is still low, so we can taper her Synthroid further. 2-3. Surgical hypothyroidism/iatrogenic hyperthyroidism: She is doing well overall.  4. Obesity: She has lost 37 pounds in 6 months, equivalent to a net calorie loss of about 700 calories per day. I am so proud of her.  5. Hypertension: Her BP is good today. Walking more and losing more fat weight will bring the BP down even more.  6. Pre-diabetes: Her HbA1c is now mid-range normal for a healthy 50 year-old woman.  7. Hair loss: She does not have any scalp thinning per se.  She may be experiencing some loss of food vitamins as she's reduced her food intake. Or, as she loses fat, she is not producing as much androgen that can be converted to estrogen. I recommended a good MVI such as Centrum for Women or One-a-Day for Women.  PLAN:  1. Diagnostic: TFTs prior to next visit. TFTS and thyroglobulin panel in March 2018 or so. 2. Therapeutic: Reduce Synthroid to 250 mcg/day (two of the 125 mcg tablets per day.) for 6 days per week, but skip one pill on Sundays. Adjust the Synthroid dose as needed to gradually allow the TSH to rise to the 0.5 level.  3. Patient education: We discussed the issues of thyroid cancer, pre-diabetes, hypertension, obesity, and hair loss at length.  4. Follow up 6 months  Level of Service: This visit lasted in excess of 50 minutes. More than 50% of the visit was devoted to counseling.  Sherrlyn Hock, MD, CDE Adult and Pediatric Endocrinology 12/15/2015 3:20 PM

## 2015-12-16 ENCOUNTER — Encounter: Payer: Self-pay | Admitting: "Endocrinology

## 2016-06-05 LAB — T3, FREE: T3 FREE: 2.2 pg/mL — AB (ref 2.3–4.2)

## 2016-06-05 LAB — T4, FREE: Free T4: 1.2 ng/dL (ref 0.8–1.8)

## 2016-06-05 LAB — TSH: TSH: 3.54 m[IU]/L

## 2016-06-06 LAB — THYROGLOBULIN ANTIBODY PANEL
THYROID PEROXIDASE ANTIBODY: 6 [IU]/mL (ref <9)
Thyroglobulin: <0.1 ng/mL — ABNORMAL LOW

## 2016-06-13 ENCOUNTER — Ambulatory Visit (INDEPENDENT_AMBULATORY_CARE_PROVIDER_SITE_OTHER): Payer: Self-pay | Admitting: "Endocrinology

## 2016-06-26 ENCOUNTER — Other Ambulatory Visit: Payer: Self-pay | Admitting: Family Medicine

## 2016-06-26 DIAGNOSIS — Z1231 Encounter for screening mammogram for malignant neoplasm of breast: Secondary | ICD-10-CM

## 2016-06-28 ENCOUNTER — Ambulatory Visit
Admission: RE | Admit: 2016-06-28 | Discharge: 2016-06-28 | Disposition: A | Discharge status: Home / Self Care | Payer: BC Managed Care – PPO | Source: Ambulatory Visit | Attending: Family Medicine | Admitting: Family Medicine

## 2016-06-28 DIAGNOSIS — Z1231 Encounter for screening mammogram for malignant neoplasm of breast: Secondary | ICD-10-CM

## 2016-07-01 ENCOUNTER — Other Ambulatory Visit: Payer: Self-pay | Admitting: "Endocrinology

## 2016-07-01 DIAGNOSIS — E0580 Other thyrotoxicosis without thyrotoxic crisis or storm: Secondary | ICD-10-CM

## 2016-07-01 DIAGNOSIS — E890 Postprocedural hypothyroidism: Secondary | ICD-10-CM

## 2016-07-04 ENCOUNTER — Ambulatory Visit (INDEPENDENT_AMBULATORY_CARE_PROVIDER_SITE_OTHER): Payer: BC Managed Care – PPO | Admitting: "Endocrinology

## 2016-07-04 VITALS — BP 118/70 | HR 90 | Wt 230.2 lb

## 2016-07-04 DIAGNOSIS — L659 Nonscarring hair loss, unspecified: Secondary | ICD-10-CM

## 2016-07-04 DIAGNOSIS — E890 Postprocedural hypothyroidism: Secondary | ICD-10-CM | POA: Diagnosis not present

## 2016-07-04 DIAGNOSIS — R7303 Prediabetes: Secondary | ICD-10-CM | POA: Diagnosis not present

## 2016-07-04 DIAGNOSIS — E6609 Other obesity due to excess calories: Secondary | ICD-10-CM

## 2016-07-04 DIAGNOSIS — E0580 Other thyrotoxicosis without thyrotoxic crisis or storm: Secondary | ICD-10-CM | POA: Diagnosis not present

## 2016-07-04 DIAGNOSIS — C73 Malignant neoplasm of thyroid gland: Secondary | ICD-10-CM | POA: Diagnosis not present

## 2016-07-04 DIAGNOSIS — I10 Essential (primary) hypertension: Secondary | ICD-10-CM | POA: Diagnosis not present

## 2016-07-04 LAB — POCT GLUCOSE (DEVICE FOR HOME USE): POC Glucose: 70 mg/dl (ref 70–99)

## 2016-07-04 LAB — POCT GLYCOSYLATED HEMOGLOBIN (HGB A1C): HEMOGLOBIN A1C: 4.9

## 2016-07-04 NOTE — Patient Instructions (Signed)
Follow up visit in 6 months. Please change Synthroid dose to  two 125 mcg tablets per day for 6 days each week, but take only 1.5 tablets per day on Sundays. Please repeat thyroid tests in 3 and 6 months.

## 2016-07-04 NOTE — Progress Notes (Signed)
Subjective:  Patient Name: Becky Martinez Date of Birth: Dec 09, 1965  MRN: [id number]  Becky Martinez  presents to the office today for follow up of her papillary thyroid cancer, post-surgical hypothyroidism, iatrogenic hyperthyroidism, morbid obesity, pre-diabetes, and hypertension.  HISTORY OF PRESENT ILLNESS:   Becky Martinez is a 51 y.o. Caucasian woman.  Becky Martinez was unaccompanied.  1. The patient developed thyroid nodules in 2002. A thyroid ultrasound on 01/03/01 showed a multinodular goiter with a dominant nodule in the right lobe measuring 21 x 21 x 29 mm. On 02/15/01 she underwent a partial thyroidectomy. 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 There was no microscopic evidence of extension of the tumor beyond the capsule. In the left lobe a 0.8 cm focus of papillary thyroid cancer was noted. No lymphovascular invasion or extracapsular extension was noted. An I-131 scan performed on 05/11/01 showed residual uptake in the thyroid bed. On 05/27/01 she received a therapeutic I-131 dose of 40.3 mCi. 3. On 09/18/02 she had a follow up I-131 nuclear scan. This study demonstrated residual activity in the right thyroid bed, compatible with residual functioning thyroid tissue and/or tumor. On 10/01/02 she received a 27 mCi dose of I-131. On 06/26/03 she had a follow up I-131 scan which showed some residual activity in the right thyroid bed area. On 07/01/03 she received 100 mCi of I-131. Follow up I-131 scan on 01/18/04 showed no residual thyroid bed activity.  2. I assumed her care in 2006. Follow up I-131 scan on 01/03/05 showed no residual thyroid bed activity. After ensuring that her TSH was then appropriately suppressed by supraphysiologic doses of Synthroid, I ordered a Thyrogen-stimulated I-131 scan on 01/19/06. This study again showed no evidence of residual thyroid bed activity. Since then I have followed her with serial thyroglobulin  measurements and anti-thyroglobulin antibody measurements. Her thyroglobulin values have consistently been below the lower limit of detection of the assay. Her anti-thyroglobulin antibody values have consistently been below 20.  3. The patient's last [facility] visit was on 12/15/15:    A. At that visit I reduced her Synthroid dose to 250 mcg/day for 6 days each week but only 125 mcg/day on Sundays.    B. In the interim she has been healthy.   C. She is exercising every day and keeping her calorie intake at about 1000 calories per day. Her husband is working with her.   3.. Constitutional. The patient feels "good, no problems". She is not noticing as much hair loss as before. Energy level is "good". The patient usually sleeps well, but intermittent been dreaming more. She is taking in less caffeine. Nobody tells her that she is snoring. She now gets colder quicker. Her weight has decreased 20 more pounds in the past 7 months. Her clothes are fitting more loosely.  Eyes: The patient's vision is slowly changing, c/w presbyopia. She needs glasses for reading. There are no significant problems with soreness, bulging, or limited range of eye movements. Neck: The patient is not aware of any problems relating to the anterior neck and thyroid bed. There have been no significant problems swelling, pain, soreness, tenderness, pressure, discomfort, or difficulty swallowing. Heart: The patient feels the expected increase in heart rate during exercise or other physical activities. There have been no significant problems with palpitations, irregular heart beats, chest pain, or chest pressure. Gastrointestinal: Stomach and intestines seem to be working normally. Bowel movements are normal. There are no significant complaints  of excessive hunger, acid reflux, upset stomach, stomach aches or pains, diarrhea, or constipation. Musculoskeletal: Muscles and extremities appear to be working normally. There are no significant  problems with hand tremor, sweaty palms, palmar erythema, or lower leg swelling. Psychological: "good".  Mental: The patient is still doing well in terms of her abilities to think, to pay attention, to remember, and to make decisions GYN: LMP was last week. Periods are regular.      PAST MEDICAL, FAMILY, AND SOCIAL HISTORY:  No past medical history on file.  No family history on file.   Current Outpatient Prescriptions:  .  ferrous sulfate 325 (65 FE) MG tablet, Take 1 tablet (325 mg total) by mouth daily., Disp: 90 tablet, Rfl: 4 .  SYNTHROID 125 MCG tablet, TAKE TWO BRAND SYNTHROID 125 MCG TABLETS DAILY., Disp: 60 tablet, Rfl: 6  Allergies as of 07/04/2016  . (No Known Allergies)    1. 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 She finished her master's degree in education in August 2016, so now has more time to herself.  2. Activities: Daily exercise 3. Smoking, alcohol, or drugs: None 4. Primary Care Provider: [facility]. [facility]: There are no other significant problems involving Madelena's other body systems.   Objective:  Vital Signs:  BP 118/70   Pulse 90   Wt 230 lb 3.2 oz (104.4 kg)    Ht Readings from Last 3 Encounters:  No data found for Ht   Wt Readings from Last 3 Encounters:  07/04/16 230 lb 3.2 oz (104.4 kg)  12/15/15 250 lb (113.4 kg)  06/14/15 287 lb (130.2 kg)    PHYSICAL EXAM:  Constitutional: The patient looks great today. She is bright, alert, happy, smiling, perky, upbeat, has a great sense of humor, and looks much less heavy. She has lost 20 more pounds since her last visit.    Face: Her face appears normal.  Eyes: There is no obvious arcus or proptosis. Moisture appears normal. Mouth: The oropharynx and tongue appear normal. Oral moisture is normal. Neck: The neck appears to be visibly normal. No carotid bruits are noted. The thyroid gland is absent. The previous  induration of her left strap muscle has resolved. She has no palpable cervical nodes or masses in the thyroid bed. Her supraclavicular areas are clear. She does not have any significant lymphadenopathy.  Lungs: Clear, moves air well Heart: Regular rate and rhythm, Normal S1 and S2 Abdomen; Enlarged, soft, non-tender Hands: No tremor, normal palms, no numbness to touch in her 1st, 2nd, 3rd, and 4th fingers Legs: No edema Neuro: 5+ strength in the UEs and LEs. Sensation to touch is intact in both legs.  Scalp: She does not have any areas of thinning.   LAB DATA:  Labs 07/04/16: HbA1c 4.9%, CBG 70  Labs 06/05/16: TSH 3.54, free T4 1.2, free T3 2.2, thyroglobulin <0.1, thyroglobulin antibody <1  Labs 12/09/15: HbA1c 4.9%; TSH 0.01, free T4 2.4, free T3 3.5  Labs 05/29/15: HbA1c 5.3%; TSH 0.01, free T4 3.4, free T3 5.4, thyroglobulin <0.1, thyroglobulin antibody <1, TPO antibody 7  Labs 12/15/14: HbA1c 4.9%; other labs pending  Labs 06/08/14; HbA1c 5.4%; TSH < 0.008, free T4 2.83, free T3 4.4, thyroglobulin <0.1, anti-thyroglobulin antibody <1  Labs 12/10/13: HbA1c 5.0%  Lab 06/03/13: HbA1c 5.4%; TSH 0.017, free T4 2.81, free T3 4.3, TPO antibody 11.2, thyroglobulin < 0.2, Tg antibody < 20  Lab 01/24/12: TSH 45.646, free T4 0.78, free T3 1.9, thyroglobulin < 0.2, Tg antibody < 20  Lab 11/10/10: TSH 4.896, free T4 1.50, free T3 2.4  Lab 06/26/10: Thyroglobulin < 0.2, thyroglobulin antibody < 20, TSH 0.199, free T4 1.71, free T3 2.7, HbA1c 4.7%   Assessment and Plan:   ASSESSMENT:  1-3. Thyroid cancer/post-surgical hypothyroidism/iatrogenic hyperthyroidism:   A. She has no clinical evidence for recurrence of thyroid cancer. Her thyroglobulin levels through the present have been below the lower limit of detection of the assay ("unmeasurable") since we began measuring them at her first visit with me in 2006.  It appears that her thyroid cancer has been cured.  Her risk of having a recurrence of  thyroid cancer is very, very low.   B. Since her thyroglobulin was very good in September 2017, but her TSH was still very suppressed, I reduced her Synthroid dose by about 7%.  C. Now however, her TSH is actually elevated and her free T4 is lower and her free T3 is low for age. We need to adjust her Synthroid again.   4. Obesity: She has lost 57 pounds in 12 months. I am very proud of her.  5. Hypertension: Her BP is good today. Walking more and losing more fat weight will help to control the BP over time.   6. Pre-diabetes: Her HbA1c is now mid-range normal for a healthy 51 year-old woman.  7. Hair loss: She does not have any scalp thinning per se.  She is taking either Centrum for Women or One-a-Day for Women.  PLAN:  1. Diagnostic: TFTs in 3 months. Repeat TFTs in 6 months. Repeat thyroglobulin and Tg antibody in one year. 2. Therapeutic: Increase the Synthroid to 250 mcg/day (two of the 125 mcg tablets per day.) for 6 days per week, but take 1.5 of the 125 mcg pills on Sundays. Adjust the Synthroid dose as needed to maintain the TSH level in the 1.0-2.0 range.  3. Patient education: We discussed the issues of thyroid cancer, pre-diabetes, hypertension, obesity, and hair loss at length.  4. Follow up 6 months  Level of Service: This visit lasted in excess of 50 minutes. More than 50% of the visit was devoted to counseling.  Tillman Sers, MD, CDE Adult and Pediatric Endocrinology 07/04/2016 3:42 PM

## 2016-07-05 ENCOUNTER — Encounter (INDEPENDENT_AMBULATORY_CARE_PROVIDER_SITE_OTHER): Payer: Self-pay | Admitting: "Endocrinology

## 2016-11-13 ENCOUNTER — Other Ambulatory Visit (INDEPENDENT_AMBULATORY_CARE_PROVIDER_SITE_OTHER): Payer: Self-pay | Admitting: "Endocrinology

## 2017-01-08 ENCOUNTER — Encounter (INDEPENDENT_AMBULATORY_CARE_PROVIDER_SITE_OTHER): Payer: Self-pay | Admitting: "Endocrinology

## 2017-01-08 ENCOUNTER — Ambulatory Visit (INDEPENDENT_AMBULATORY_CARE_PROVIDER_SITE_OTHER): Payer: BC Managed Care – PPO | Admitting: "Endocrinology

## 2017-01-08 VITALS — BP 112/64 | HR 76 | Wt 235.4 lb

## 2017-01-08 DIAGNOSIS — I10 Essential (primary) hypertension: Secondary | ICD-10-CM

## 2017-01-08 DIAGNOSIS — E890 Postprocedural hypothyroidism: Secondary | ICD-10-CM

## 2017-01-08 DIAGNOSIS — R7303 Prediabetes: Secondary | ICD-10-CM

## 2017-01-08 DIAGNOSIS — C73 Malignant neoplasm of thyroid gland: Secondary | ICD-10-CM

## 2017-01-08 DIAGNOSIS — L659 Nonscarring hair loss, unspecified: Secondary | ICD-10-CM

## 2017-01-08 LAB — POCT GLYCOSYLATED HEMOGLOBIN (HGB A1C): HEMOGLOBIN A1C: 5.2

## 2017-01-08 LAB — POCT GLUCOSE (DEVICE FOR HOME USE): POC Glucose: 90 mg/dl (ref 70–99)

## 2017-01-08 NOTE — Patient Instructions (Signed)
Follow up visit in 6 months. 

## 2017-01-08 NOTE — Progress Notes (Signed)
Subjective:  Patient Name: Becky Martinez Date of Birth: 1965-05-19  MRN: [id number]  Annakate Soulier  presents to the office today for follow up of her papillary thyroid cancer, post-surgical hypothyroidism, iatrogenic hyperthyroidism, morbid obesity, pre-diabetes, and hypertension.  HISTORY OF PRESENT ILLNESS:   Chealsea is a 51 y.o. Caucasian woman.  Jessaca was unaccompanied.  1. The patient developed thyroid nodules in 2002. A thyroid ultrasound on 01/03/01 showed a multinodular goiter with a dominant nodule in the right lobe measuring 21 x 21 x 29 mm. On 02/15/01 she underwent a partial thyroidectomy. 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 There was no microscopic evidence of extension of the tumor beyond the capsule. In the left lobe a 0.8 cm focus of papillary thyroid cancer was noted. No lymphovascular invasion or extracapsular extension was noted. An I-131 scan performed on 05/11/01 showed residual uptake in the thyroid bed. On 05/27/01 she received a therapeutic I-131 dose of 40.3 mCi. 3. On 09/18/02 she had a follow up I-131 nuclear scan. This study demonstrated residual activity in the right thyroid bed, compatible with residual functioning thyroid tissue and/or tumor. On 10/01/02 she received a 27 mCi dose of I-131. On 06/26/03 she had a follow up I-131 scan which showed some residual activity in the right thyroid bed area. On 07/01/03 she received 100 mCi of I-131. Follow up I-131 scan on 01/18/04 showed no residual thyroid bed activity.  2. I assumed her care in 2006. Follow up I-131 scan on 01/03/05 showed no residual thyroid bed activity. After ensuring that her TSH was then appropriately suppressed by supraphysiologic doses of Synthroid, I ordered a Thyrogen-stimulated I-131 scan on 01/19/06. This study again showed no evidence of residual thyroid bed activity. Since then I have followed her with serial thyroglobulin  measurements and anti-thyroglobulin antibody measurements. Her thyroglobulin values have consistently been below the lower limit of detection of the assay. Her anti-thyroglobulin antibody values have consistently been below 20.  3. The patient's last [facility] visit was on 07/04/16:    A. At that visit I increased her Synthroid dose to 250 mcg/day for 6 days each week and 1.5 of the 125 mcg pills on Sundays.    B. In the interim she has been healthy.   C. She was exercising every day and keeping her calorie intake at about 1000 calories per day, but missed exercising for the past two weeks. She has been helping her mom take care of her aunt's estate. Her husband is walking with her.   D. She is also taking ferrous sulfate, one 325 mg tablet per day.   3.. Constitutional. The patient feels "good". She is not noticing any hair loss now. Energy level is "good". The patient usually sleeps well, but occasionally has dreams. She only drinks water. Nobody tells her that she is snoring. She now gets colder quicker. Her weight has increased 5 pounds in the past 6 months.  Eyes: The patient's vision is slowly changing, c/w presbyopia. She needs glasses for reading. There are no significant problems with soreness, bulging, or limited range of eye movements. Neck: The patient is not aware of any problems relating to the anterior neck and thyroid bed. There have been no significant problems swelling, pain, soreness, tenderness, pressure, discomfort, or difficulty swallowing. Heart: The patient feels the expected increase in heart rate during exercise or other physical activities. There have been no significant problems with palpitations, irregular heart  beats, chest pain, or chest pressure. Gastrointestinal: Stomach and intestines seem to be working normally. Bowel movements are normal. There are no significant complaints of excessive hunger, acid reflux, upset stomach, stomach aches or pains, diarrhea, or  constipation. Musculoskeletal: She sometimes has numbness of the ulnar aspects of her forearms when she is doing a lot of keyboard work. Muscles and extremities appear to be working normally. There are no significant problems with hand tremor, sweaty palms, palmar erythema, or lower leg swelling. Psychological: "good, no problems" Mental: The patient is still doing well in terms of her abilities to think, to pay attention, to remember, and to make decisions GYN: LMP was October 9th. Periods are regular.      PAST MEDICAL, FAMILY, AND SOCIAL HISTORY:  No past medical history on file.  No family history on file.   Current Outpatient Prescriptions:  .  ferrous sulfate 325 (65 FE) MG tablet, TAKE 1 TABLET (325 MG TOTAL) BY MOUTH DAILY., Disp: 90 tablet, Rfl: 3 .  SYNTHROID 125 MCG tablet, TAKE TWO BRAND SYNTHROID 125 MCG TABLETS DAILY., Disp: 60 tablet, Rfl: 6  Allergies as of 01/08/2017  . (No Known Allergies)    1. 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 She finished her master's degree in education in August 2016, so now has more time to herself.  2. Activities: Daily exercise, helping her mother 3. Smoking, alcohol, or drugs: None 4. Primary Care Provider: [facility] [place]: There are no other significant problems involving Miangel's other body systems.   Objective:  Vital Signs:  BP 112/64   Pulse 76   Wt 235 lb 6.4 oz (106.8 kg)   LMP 01/02/2017 (Approximate)    Ht Readings from Last 3 Encounters:  No data found for Ht   Wt Readings from Last 3 Encounters:  01/08/17 235 lb 6.4 oz (106.8 kg)  07/04/16 230 lb 3.2 oz (104.4 kg)  12/15/15 250 lb (113.4 kg)    PHYSICAL EXAM:  Constitutional: The patient looks good today. She is bright and alert, but looks a bit tired today. Her affect and insight are good. She has re-gained 5 pounds since her last visit.    Face: Her face appears normal.   Eyes: There is no obvious arcus or proptosis. Moisture appears normal. Mouth: The oropharynx and tongue appear normal. Oral moisture is normal. Neck: The neck appears to be visibly normal. No carotid bruits are noted. The thyroid gland is absent. The previous induration of her left strap muscle has resolved. She has no palpable cervical nodes or masses in the thyroid bed. Her supraclavicular areas are clear. She does not have any significant lymphadenopathy.  Lungs: Clear, moves air well Heart: Regular rate and rhythm, Normal S1 and S2 Abdomen; Enlarged, soft, non-tender Hands: No tremor, normal palms  Legs: No edema Neuro: 5+ strength in the UEs and LEs. Sensation to touch is intact in both legs.  Scalp: She does not have any areas of thinning.   LAB DATA:  Labs 01/08/17: HbA1c 5.2%, CBG 90  Labs 07/04/16: HbA1c 4.9%, CBG 70  Labs 06/05/16: TSH 3.54, free T4 1.2, free T3 2.2, thyroglobulin <0.1, thyroglobulin antibody <1  Labs 12/09/15: HbA1c 4.9%; TSH 0.01, free T4 2.4, free T3 3.5  Labs 05/29/15: HbA1c 5.3%; TSH 0.01, free T4 3.4, free T3 5.4, thyroglobulin <0.1, thyroglobulin antibody <1, TPO antibody 7  Labs 12/15/14: HbA1c 4.9%; other labs  pending  Labs 06/08/14; HbA1c 5.4%; TSH < 0.008, free T4 2.83, free T3 4.4, thyroglobulin <0.1, anti-thyroglobulin antibody <1  Labs 12/10/13: HbA1c 5.0%  Lab 06/03/13: HbA1c 5.4%; TSH 0.017, free T4 2.81, free T3 4.3, TPO antibody 11.2, thyroglobulin < 0.2, Tg antibody < 20  Lab 01/24/12: TSH 45.646, free T4 0.78, free T3 1.9, thyroglobulin < 0.2, Tg antibody < 20  Lab 11/10/10: TSH 4.896, free T4 1.50, free T3 2.4  Lab 06/26/10: Thyroglobulin < 0.2, thyroglobulin antibody < 20, TSH 0.199, free T4 1.71, free T3 2.7, HbA1c 4.7%   Assessment and Plan:   ASSESSMENT:  1-3. Thyroid cancer/post-surgical hypothyroidism/iatrogenic hyperthyroidism:   A. She has no clinical evidence for recurrence of thyroid cancer. Her thyroglobulin levels through  March 2018 have been below the lower limit of detection of the assay ("unmeasurable") since we began measuring them at her first visit with me in 2006.  It appears that her thyroid cancer has been cured.  Her risk of having a recurrence of thyroid cancer is very, very low.   B. Since her thyroglobulin was very good in September 2017, but her TSH was still very suppressed, I reduced her Synthroid dose by about 7%. At her last visit in April, however, her TSH was elevated, her free T4 was lower, and her free T3 was low for age. We increased her Synthroid dose slightly at that time.    4. Obesity: She had lost 57 pounds in the 12 months prior to her April 2018 visit, but regained 5 pounds since then.   5. Hypertension: Her BP is good today. Walking more and losing more fat weight will help to control the BP over time.   6. Pre-diabetes: Her HbA1c had decreased to the mid-range when she was actively losing weight, but has increased since then. Her current HbA1c is well within normal limits, but at about the 75-80% of the normal range.normal for a healthy 51 year-old woman.  7. Hair loss: She does not have any scalp thinning per se.  She is taking either Centrum for Women or One-a-Day for Women.  PLAN:  1. Diagnostic: TFTs today. Repeat TFTs in 3 months. Repeat TFTs, thyroglobulin, and thyroglobulin antibody in 6 months.  . 2. Therapeutic: Continue the current Synthroid doses now: 250 mcg/day (two of the 125 mcg tablets per day) for 6 days per week, but take 1.5 of the 125 mcg pills on Sundays. Adjust the Synthroid dose as needed to maintain the TSH level in the 1.0-2.0 range.  3. Patient education: We discussed the issues of thyroid cancer, pre-diabetes, hypertension, obesity, and hair loss at length.  4. Follow up 6 months  Level of Service: This visit lasted in excess of 55 minutes. More than 50% of the visit was devoted to counseling.  Tillman Sers, MD, CDE Adult and Pediatric  Endocrinology 01/08/2017 4:03 PM

## 2017-01-09 LAB — TSH: TSH: 0.02 mIU/L — ABNORMAL LOW

## 2017-01-09 LAB — THYROGLOBULIN ANTIBODY

## 2017-01-09 LAB — THYROGLOBULIN LEVEL: Thyroglobulin: <0.1 ng/mL — ABNORMAL LOW

## 2017-01-09 LAB — T4, FREE: Free T4: 2.4 ng/dL — ABNORMAL HIGH (ref 0.8–1.8)

## 2017-01-09 LAB — T3, FREE: T3, Free: 3.4 pg/mL (ref 2.3–4.2)

## 2017-01-15 ENCOUNTER — Other Ambulatory Visit (INDEPENDENT_AMBULATORY_CARE_PROVIDER_SITE_OTHER): Payer: Self-pay | Admitting: *Deleted

## 2017-01-15 DIAGNOSIS — C73 Malignant neoplasm of thyroid gland: Secondary | ICD-10-CM

## 2017-04-17 LAB — T3, FREE: T3 FREE: 2.8 pg/mL (ref 2.3–4.2)

## 2017-04-17 LAB — TSH: TSH: 0.02 mIU/L — ABNORMAL LOW

## 2017-04-17 LAB — T4, FREE: FREE T4: 1.9 ng/dL — AB (ref 0.8–1.8)

## 2017-04-25 ENCOUNTER — Telehealth (INDEPENDENT_AMBULATORY_CARE_PROVIDER_SITE_OTHER): Payer: Self-pay | Admitting: *Deleted

## 2017-04-25 DIAGNOSIS — E890 Postprocedural hypothyroidism: Secondary | ICD-10-CM

## 2017-04-25 DIAGNOSIS — R7303 Prediabetes: Secondary | ICD-10-CM

## 2017-04-25 NOTE — Telephone Encounter (Signed)
Spoke to patient, advised that per Dr. Tobe Sos TFTs are still too high for her. Continue to take two of the Synthroid 125 mcg tablets per day on 5 days per week, but take only one tablet per day on the other two days per week, such as Wednesdays and Sundays. Repeat labs 1 week prior to April visit, labs are in the portal. Patient expressed understanding of change.

## 2017-05-18 ENCOUNTER — Other Ambulatory Visit: Payer: Self-pay | Admitting: Family Medicine

## 2017-05-18 DIAGNOSIS — Z1231 Encounter for screening mammogram for malignant neoplasm of breast: Secondary | ICD-10-CM

## 2017-06-11 ENCOUNTER — Other Ambulatory Visit (INDEPENDENT_AMBULATORY_CARE_PROVIDER_SITE_OTHER): Payer: Self-pay | Admitting: *Deleted

## 2017-06-11 DIAGNOSIS — E890 Postprocedural hypothyroidism: Secondary | ICD-10-CM

## 2017-06-11 DIAGNOSIS — E0580 Other thyrotoxicosis without thyrotoxic crisis or storm: Secondary | ICD-10-CM

## 2017-06-11 MED ORDER — SYNTHROID 125 MCG PO TABS: ORAL_TABLET | ORAL | 1 refills | Status: DC

## 2017-06-29 ENCOUNTER — Ambulatory Visit
Admission: RE | Admit: 2017-06-29 | Discharge: 2017-06-29 | Disposition: A | Discharge status: Home / Self Care | Payer: BC Managed Care – PPO | Source: Ambulatory Visit | Attending: Family Medicine | Admitting: Family Medicine

## 2017-06-29 DIAGNOSIS — Z1231 Encounter for screening mammogram for malignant neoplasm of breast: Secondary | ICD-10-CM

## 2017-07-03 ENCOUNTER — Other Ambulatory Visit (INDEPENDENT_AMBULATORY_CARE_PROVIDER_SITE_OTHER): Payer: Self-pay | Admitting: *Deleted

## 2017-07-03 DIAGNOSIS — C73 Malignant neoplasm of thyroid gland: Secondary | ICD-10-CM

## 2017-07-04 LAB — THYROGLOBULIN ANTIBODY: Thyroglobulin Ab: <1 IU/mL (ref <=1)

## 2017-07-04 LAB — T3, FREE: T3 FREE: 2.6 pg/mL (ref 2.3–4.2)

## 2017-07-04 LAB — TSH: TSH: 0.01 m[IU]/L — AB

## 2017-07-04 LAB — THYROGLOBULIN LEVEL: Thyroglobulin: <0.1 ng/mL — ABNORMAL LOW

## 2017-07-04 LAB — T4, FREE: FREE T4: 1.9 ng/dL — AB (ref 0.8–1.8)

## 2017-07-04 LAB — HEMOGLOBIN A1C
EAG (MMOL/L): 5.4 (calc)
HEMOGLOBIN A1C: 5 %{Hb} (ref <5.7)
MEAN PLASMA GLUCOSE: 97 (calc)

## 2017-07-09 ENCOUNTER — Encounter (INDEPENDENT_AMBULATORY_CARE_PROVIDER_SITE_OTHER): Payer: Self-pay | Admitting: "Endocrinology

## 2017-07-09 ENCOUNTER — Ambulatory Visit (INDEPENDENT_AMBULATORY_CARE_PROVIDER_SITE_OTHER): Payer: BC Managed Care – PPO | Admitting: "Endocrinology

## 2017-07-09 ENCOUNTER — Telehealth (INDEPENDENT_AMBULATORY_CARE_PROVIDER_SITE_OTHER): Payer: Self-pay | Admitting: "Endocrinology

## 2017-07-09 VITALS — BP 122/68 | HR 86 | Ht 64.57 in | Wt 254.4 lb

## 2017-07-09 DIAGNOSIS — R7303 Prediabetes: Secondary | ICD-10-CM

## 2017-07-09 DIAGNOSIS — L659 Nonscarring hair loss, unspecified: Secondary | ICD-10-CM

## 2017-07-09 DIAGNOSIS — E890 Postprocedural hypothyroidism: Secondary | ICD-10-CM | POA: Diagnosis not present

## 2017-07-09 DIAGNOSIS — E0580 Other thyrotoxicosis without thyrotoxic crisis or storm: Secondary | ICD-10-CM | POA: Diagnosis not present

## 2017-07-09 DIAGNOSIS — I10 Essential (primary) hypertension: Secondary | ICD-10-CM | POA: Diagnosis not present

## 2017-07-09 DIAGNOSIS — C73 Malignant neoplasm of thyroid gland: Secondary | ICD-10-CM | POA: Diagnosis not present

## 2017-07-09 NOTE — Patient Instructions (Signed)
Follow up visit in 6 months. Please reduce the Synthroid dose to two of the 125 mcg pills per day on three days each week (Monday-Wednesday-Friday) but take only one tablet per day for the other 5 days each week. Please repeat thyroid tests in 3 months and again about one week prior to next visit. Please try to obtain any needed refills prior to 11/12/17.

## 2017-07-09 NOTE — Telephone Encounter (Signed)
1. I called the patient to make a Synthroid dose correction.  2. When she was in the office today I asked her to reduce her Synthroid dose to two of the 125 mcg tablets on three days each week, but take one tablet per day for the other four days each week. 3. When I was completing her note after she left the clinic, I thought that perhaps this would be too big a decrease in Synthroid dose, so I asked her to change the dose to two tablets/day for three days each week, but on the other four days take 1.5 of the 125 mcg tab lets per day. She understood and will make that change.  Tillman Sers, MD, CDE

## 2017-07-09 NOTE — Progress Notes (Signed)
Subjective:  Patient Name: Becky Martinez Date of Birth: 1965-10-26  MRN: [id number]  Becky Martinez  presents to the office today for follow up of her papillary thyroid cancer, post-surgical hypothyroidism, iatrogenic hyperthyroidism, morbid obesity, pre-diabetes, and hypertension.  HISTORY OF PRESENT ILLNESS:   Becky Martinez is a 52 y.o. Caucasian woman.  [name] was unaccompanied.  1. The patient developed thyroid nodules in 2002. A thyroid ultrasound on 01/03/01 showed a multinodular goiter with a dominant nodule in the right lobe measuring 21 x 21 x 29 mm. On 02/15/01 she underwent a partial thyroidectomy. 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 There was no microscopic evidence of extension of the tumor beyond the capsule. In the left lobe a 0.8 cm focus of papillary thyroid cancer was noted. No lymphovascular invasion or extracapsular extension was noted. An I-131 scan performed on 05/11/01 showed residual uptake in the thyroid bed. On 05/27/01 she received a therapeutic I-131 dose of 40.3 mCi. 3. On 09/18/02 she had a follow up I-131 nuclear scan. This study demonstrated residual activity in the right thyroid bed, compatible with residual functioning thyroid tissue and/or tumor. On 10/01/02 she received a 27 mCi dose of I-131. On 06/26/03 she had a follow up I-131 scan which showed some residual activity in the right thyroid bed area. On 07/01/03 she received 100 mCi of I-131. Follow up I-131 scan on 01/18/04 showed no residual thyroid bed activity.  2. I assumed her care in 2006. Follow up I-131 scan on 01/03/05 showed no residual thyroid bed activity. After ensuring that her TSH was then appropriately suppressed by supraphysiologic doses of Synthroid, I ordered a Thyrogen-stimulated I-131 scan on 01/19/06. This study again showed no evidence of residual thyroid bed activity. Since then I have followed her with serial thyroglobulin  measurements and anti-thyroglobulin antibody measurements. Her thyroglobulin values have consistently been below the lower limit of detection of the assay. Her anti-thyroglobulin antibody values have consistently been below 20.  3. The patient's last [facility] visit was on 01/08/17:    A. At that visit I continued her Synthroid dose of 250 mcg/day (two 125 mcg tablets) for 6 days each week and 1.5 of the 125 mcg pills on Sundays. When I reviewed her lab tests in January 2019, however, she was supposed to reduce the Synthroid to two tablets 5 days each week, but take only one tablet per day for the other two days per week. Unfortunately, that change was never made.    B. In the interim she has been healthy. Her menses are changing as noted below.    C. She has been "bad" about exercising, in that she now exercises only two days per week. Her husband is still walking about 5 days each week.   D. She is also taking ferrous sulfate, one 325 mg tablet per day.   3.. Constitutional. The patient feels "good". She is not noticing any hair loss now. Energy level is "pretty good". The patient usually sleeps well. She pretty much only drinks water, but does drink coffee in the mornings. Her husband says that she snores sometimes. She says that she sometimes gets cold, but probably is average. Her weight has increased 19 pounds in the past 6 months, equivalent to about an excess of 360 calories per day.  Eyes: The patient's vision is slowly changing, c/w presbyopia. She now has prescription glasses. There are no other significant problems with her eyes.  Neck: The patient is not aware of any problems relating to the anterior neck and thyroid bed. There have not been any  significant problems of swelling, pain, soreness, tenderness, pressure, discomfort, or difficulty swallowing. Heart: The patient feels the expected increase in heart rate during exercise or other physical activities. There have been no significant  problems with palpitations, irregular heart beats, chest pain, or chest pressure. Gastrointestinal: She does not have more belly hunger. Stomach and intestines seem to be working normally. Bowel movements are normal. There are no significant complaints of excessive hunger, acid reflux, upset stomach, stomach aches or pains, diarrhea, or constipation. Musculoskeletal: She no longer has as much numbness of the ulnar aspects of her forearms, largely because she is not using her keyboard as much. Muscles and extremities appear to be working normally. There are no significant problems with hand tremor, sweaty palms, palmar erythema, or lower leg swelling. Psychological: "good, everything is well". Mental: The patient is still doing well in terms of her abilities to think, to pay attention, to remember, and to make decisions GYN: LMP occurred this past week. Periods still occur regularly, but are changing in terms of duration of flow and pain level.      PAST MEDICAL, FAMILY, AND SOCIAL HISTORY:  No past medical history on file.  No family history on file.   Current Outpatient Medications:  .  ferrous sulfate 325 (65 FE) MG tablet, TAKE 1 TABLET (325 MG TOTAL) BY MOUTH DAILY., Disp: 90 tablet, Rfl: 3 .  SYNTHROID 125 MCG tablet, TAKE TWO BRAND SYNTHROID 125 MCG TABLETS DAILY., Disp: 180 tablet, Rfl: 1  Allergies as of 07/09/2017  . (No Known Allergies)    1. 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 She finished her master's degree in education in August 2016, so now has more time to herself.  2. Activities: Daily exercise, helping her mother 3. Smoking, alcohol, or drugs: None 4. Primary Care Provider: [facility]. [facility] [place]: There are no other significant problems involving Glender's other body systems.   Objective:  Vital Signs:  BP 122/68   Pulse 86   Ht 5' 4.57" (1.64 m)   Wt 254 lb 6.4 oz (115.4 kg)   LMP  07/06/2017 (Exact Date)   BMI 42.90 kg/m    Ht Readings from Last 3 Encounters:  07/09/17 5' 4.57" (1.64 m)   Wt Readings from Last 3 Encounters:  07/09/17 254 lb 6.4 oz (115.4 kg)  01/08/17 235 lb 6.4 oz (106.8 kg)  07/04/16 230 lb 3.2 oz (104.4 kg)    PHYSICAL EXAM:  Constitutional: The patient looks good today. She is bright and alert. Her affect and insight are good. She has gained 19 pounds since her last visit.    Face: Her face appears normal.  Eyes: There is no obvious arcus or proptosis. Moisture appears normal. Mouth: The oropharynx and tongue appear normal. Oral moisture is normal. Neck: The neck appears to be visibly normal. No carotid bruits are noted. The thyroid gland is absent. The previous induration of her left strap muscle has resolved. She has no palpable cervical nodes or masses in the thyroid bed. Her supraclavicular areas are clear. She does not have any significant lymphadenopathy.  Lungs: Clear, moves air well Heart: Regular rate and rhythm, Normal S1 and S2 Abdomen; Enlarged, soft, non-tender Hands: No tremor, normal palms  Legs: No edema Neuro: 5+ strength in the  UEs and LEs. Sensation to touch is intact in both legs.  Scalp: She does not have any areas of thinning.   LAB DATA:  Labs 07/03/17: HbA1c 5.0%; TSH 0.01, free T4 1.9, free T3 2.6, thyroglobulin <0.1, thyroglobulin antibody <1  Labs 04/16/17: TSH 0.02, free T4 1.9, free T3 2.8  Labs 01/08/17: HbA1c 5.2%, CBG 90  Labs 07/04/16: HbA1c 4.9%, CBG 70  Labs 06/05/16: TSH 3.54, free T4 1.2, free T3 2.2, thyroglobulin <0.1, thyroglobulin antibody <1  Labs 12/09/15: HbA1c 4.9%; TSH 0.01, free T4 2.4, free T3 3.5  Labs 05/29/15: HbA1c 5.3%; TSH 0.01, free T4 3.4, free T3 5.4, thyroglobulin <0.1, thyroglobulin antibody <1, TPO antibody 7  Labs 12/15/14: HbA1c 4.9%; other labs pending  Labs 06/08/14; HbA1c 5.4%; TSH < 0.008, free T4 2.83, free T3 4.4, thyroglobulin <0.1, anti-thyroglobulin antibody  <1  Labs 12/10/13: HbA1c 5.0%  Lab 06/03/13: HbA1c 5.4%; TSH 0.017, free T4 2.81, free T3 4.3, TPO antibody 11.2, thyroglobulin < 0.2, Tg antibody < 20  Lab 01/24/12: TSH 45.646, free T4 0.78, free T3 1.9, thyroglobulin < 0.2, Tg antibody < 20  Lab 11/10/10: TSH 4.896, free T4 1.50, free T3 2.4  Lab 06/26/10: Thyroglobulin < 0.2, thyroglobulin antibody < 20, TSH 0.199, free T4 1.71, free T3 2.7, HbA1c 4.7%   Assessment and Plan:   ASSESSMENT:  1-3. Thyroid cancer/post-surgical hypothyroidism/iatrogenic hyperthyroidism:   A. She has no clinical evidence for recurrence of thyroid cancer. Her thyroglobulin levels through April 2019 have been below the lower limit of detection of the assay ("unmeasurable") since we began measuring them at her first visit with me in 2006.  It appears that her thyroid cancer has been cured.  Her risk of having a recurrence of thyroid cancer is very, very low.   B. Since her thyroglobulin was very good in September 2017, but her TSH was still very suppressed, I reduced her Synthroid dose by about 7%. At her last visit in April, however, her TSH was elevated, her free T4 was lower, and her free T3 was low for age. We increased her Synthroid dose slightly at that time.    C. Since then, however, her TSH has decreased markedly, so we need to reduce her Synthroid dose again.   4. Obesity: She had lost 57 pounds in the 12 months prior to her April 2018 visit, but regained 24 pounds since then. She has not been exercising as much as she knows that she needs to do.  5. Hypertension: Her BP is good today. Walking more and losing more fat weight will help to control the BP over time.   6. Pre-diabetes: Her HbA1c had decreased to the mid-range when she was actively losing weight, increased later, but has decreased again, in part due to her prior weight loss. Her current HbA1c is mid-normal for a healthy 52 year-old woman.  7. Hair loss: She does not have any scalp thinning per se.   She no longer taking either Centrum for Women or One-a-Day for Women.  PLAN:  1. Diagnostic: Repeat TFTs in 3 months and 6 months. Repeat TFTs, thyroglobulin, and thyroglobulin antibody in 12 months.   2. Therapeutic: Reduce Synthroid doses to: 250 mcg/day (two of the 125 mcg tablets per day) for 3 days per week, for example Monday-Wednesday-Friday, but take only 1.5 tablets per day for the other 4 days each week.  Adjust the Synthroid dose as needed to maintain the TSH level in the 1.0-2.0 range.  3. Patient education:  We discussed the issues of thyroid cancer, pre-diabetes, hypertension, obesity, and hair loss at length.  4. Follow up 6 months  Level of Service: This visit lasted in excess of 55 minutes. More than 50% of the visit was devoted to counseling.  Tillman Sers, MD, CDE Adult and Pediatric Endocrinology 07/09/2017 2:50 PM

## 2017-11-20 ENCOUNTER — Other Ambulatory Visit (INDEPENDENT_AMBULATORY_CARE_PROVIDER_SITE_OTHER): Payer: Self-pay | Admitting: "Endocrinology

## 2017-11-20 DIAGNOSIS — E890 Postprocedural hypothyroidism: Secondary | ICD-10-CM

## 2017-11-20 DIAGNOSIS — E0580 Other thyrotoxicosis without thyrotoxic crisis or storm: Secondary | ICD-10-CM

## 2017-12-06 ENCOUNTER — Other Ambulatory Visit (INDEPENDENT_AMBULATORY_CARE_PROVIDER_SITE_OTHER): Payer: Self-pay | Admitting: "Endocrinology

## 2017-12-06 ENCOUNTER — Other Ambulatory Visit (INDEPENDENT_AMBULATORY_CARE_PROVIDER_SITE_OTHER): Payer: Self-pay | Admitting: *Deleted

## 2017-12-06 DIAGNOSIS — E0580 Other thyrotoxicosis without thyrotoxic crisis or storm: Secondary | ICD-10-CM

## 2017-12-06 DIAGNOSIS — E890 Postprocedural hypothyroidism: Secondary | ICD-10-CM

## 2017-12-06 MED ORDER — SYNTHROID 125 MCG PO TABS: ORAL_TABLET | ORAL | 2 refills | Status: DC

## 2017-12-12 ENCOUNTER — Other Ambulatory Visit (INDEPENDENT_AMBULATORY_CARE_PROVIDER_SITE_OTHER): Payer: Self-pay | Admitting: "Endocrinology

## 2018-08-05 ENCOUNTER — Other Ambulatory Visit: Payer: Self-pay | Admitting: Family Medicine

## 2018-08-05 DIAGNOSIS — Z1231 Encounter for screening mammogram for malignant neoplasm of breast: Secondary | ICD-10-CM

## 2018-09-21 ENCOUNTER — Other Ambulatory Visit (INDEPENDENT_AMBULATORY_CARE_PROVIDER_SITE_OTHER): Payer: Self-pay | Admitting: "Endocrinology

## 2018-09-21 DIAGNOSIS — E890 Postprocedural hypothyroidism: Secondary | ICD-10-CM

## 2018-09-21 DIAGNOSIS — E0580 Other thyrotoxicosis without thyrotoxic crisis or storm: Secondary | ICD-10-CM

## 2018-09-26 ENCOUNTER — Other Ambulatory Visit: Payer: Self-pay

## 2018-09-26 ENCOUNTER — Ambulatory Visit
Admission: RE | Admit: 2018-09-26 | Discharge: 2018-09-26 | Disposition: A | Discharge status: Home / Self Care | Payer: BC Managed Care – PPO | Source: Ambulatory Visit | Attending: Family Medicine | Admitting: Family Medicine

## 2018-09-26 DIAGNOSIS — Z1231 Encounter for screening mammogram for malignant neoplasm of breast: Secondary | ICD-10-CM

## 2018-10-02 ENCOUNTER — Other Ambulatory Visit (INDEPENDENT_AMBULATORY_CARE_PROVIDER_SITE_OTHER): Payer: Self-pay | Admitting: "Endocrinology

## 2018-10-02 ENCOUNTER — Other Ambulatory Visit: Payer: Self-pay

## 2018-10-02 ENCOUNTER — Encounter (INDEPENDENT_AMBULATORY_CARE_PROVIDER_SITE_OTHER): Payer: Self-pay | Admitting: "Endocrinology

## 2018-10-02 ENCOUNTER — Ambulatory Visit (INDEPENDENT_AMBULATORY_CARE_PROVIDER_SITE_OTHER): Payer: BC Managed Care – PPO | Admitting: "Endocrinology

## 2018-10-02 VITALS — BP 109/71 | HR 66 | Wt 262.8 lb

## 2018-10-02 DIAGNOSIS — R7303 Prediabetes: Secondary | ICD-10-CM

## 2018-10-02 DIAGNOSIS — E890 Postprocedural hypothyroidism: Secondary | ICD-10-CM

## 2018-10-02 DIAGNOSIS — E0580 Other thyrotoxicosis without thyrotoxic crisis or storm: Secondary | ICD-10-CM

## 2018-10-02 DIAGNOSIS — C73 Malignant neoplasm of thyroid gland: Secondary | ICD-10-CM

## 2018-10-02 DIAGNOSIS — L659 Nonscarring hair loss, unspecified: Secondary | ICD-10-CM

## 2018-10-02 DIAGNOSIS — I10 Essential (primary) hypertension: Secondary | ICD-10-CM

## 2018-10-02 LAB — POCT GLUCOSE (DEVICE FOR HOME USE): POC Glucose: 94 mg/dl (ref 70–99)

## 2018-10-02 LAB — POCT GLYCOSYLATED HEMOGLOBIN (HGB A1C): Hemoglobin A1C: 5 % (ref 4.0–5.6)

## 2018-10-02 NOTE — Patient Instructions (Signed)
Follow up visit in 12 months. Please repeat lab tests about two weeks prior.

## 2018-10-02 NOTE — Progress Notes (Signed)
Subjective:  Patient Name: Becky Martinez Date of Birth: October 03, 1965  MRN: [id number]  Becky Martinez  presents to the office today for follow up of her papillary thyroid cancer, post-surgical hypothyroidism, iatrogenic hyperthyroidism, morbid obesity, pre-diabetes, and hypertension.  HISTORY OF PRESENT ILLNESS:   Becky Martinez is a 53 y.o. Caucasian woman.  Becky Martinez was unaccompanied.  1. The patient developed thyroid nodules in 2002. A thyroid ultrasound on 01/03/01 showed a multinodular goiter with a dominant nodule in the right lobe measuring 21 x 21 x 29 mm. On 02/15/01 she underwent a total thyroidectomy. 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 There was no microscopic evidence of extension of the tumor beyond the capsule. In the left lobe a 0.8 cm focus of papillary thyroid cancer was noted. No lymphovascular invasion or extracapsular extension was noted. An I-131 scan performed on 05/11/01 showed residual uptake in the thyroid bed. On 05/27/01 she received a therapeutic I-131 dose of 40.3 mCi. 3. On 09/18/02 she had a follow up I-131 nuclear scan. This study demonstrated residual activity in the right thyroid bed, compatible with residual functioning thyroid tissue and/or tumor. On 10/01/02 she received a 27 mCi dose of I-131. On 06/26/03 she had a follow up I-131 scan which showed some residual activity in the right thyroid bed area. On 07/01/03 she received 100 mCi of I-131. Follow up I-131 scan on 01/18/04 showed no residual thyroid bed activity.  2. I assumed her care in 2006. Follow up I-131 scan on 01/03/05 showed no residual thyroid bed activity. After ensuring that her TSH was then appropriately suppressed by supraphysiologic doses of Synthroid, I ordered a Thyrogen-stimulated I-131 scan on 01/19/06. This study again showed no evidence of residual thyroid bed activity. Since then I have followed her with serial thyroglobulin  measurements and anti-thyroglobulin antibody measurements. Her thyroglobulin values have consistently been below the lower limit of detection of the assay. Her anti-thyroglobulin antibody values have consistently been below 20.  3. The patient's last [facility] visit was on 07/09/17:    A. At that visit I changed her Synthroid dosage to 250 mcg/day (two 125 mcg tablets) for 3 days each week and 1.5 of the 125 mcg pills on four days each week.   B. In the interim she has been healthy.   B. Her menses are more irregular now. Sometimes she has headache, sometimes nausea and vomiting associated with her menses.     C. She has been walking every day.   D. She is also taking ferrous sulfate, one 325 mg tablet each evening.   3.. Constitutional. Becky Martinez feels "good". She is not noticing any hair loss now. Energy level is "good". She usually sleeps well. She pretty much only drinks water, but does drink coffee in the mornings. Her husband says that she snores sometimes. She says that she sometimes gets colder than others around her. Her weight has increased 8 pounds in the past 15 months, equivalent to about an excess of 55 calories per day.  Eyes: Becky Martinez's vision is slowly changing, c/w presbyopia. She now has prescription glasses. There are no other significant problems with her eyes.  Neck: Becky Martinez is not aware of any problems relating to the anterior neck and thyroid bed. There have not been any  significant problems of swelling, pain, soreness, tenderness, pressure, discomfort, or difficulty swallowing. Heart: Becky Martinez feels the expected increase in heart rate during exercise or other physical activities. There have  been no significant problems with palpitations, irregular heart beats, chest pain, or chest pressure. Gastrointestinal: She does not have much belly hunger. Stomach and intestines seem to be working normally. Bowel movements are normal. There are no significant complaints of excessive hunger, acid reflux,  upset stomach, stomach aches or pains, diarrhea, or constipation. Musculoskeletal: She no longer has as much numbness of the ulnar aspects of her forearms, largely because she is not using her keyboard as much. Muscles and extremities appear to be working normally. There are no significant problems with hand tremor, sweaty palms, palmar erythema, or lower leg swelling. Psychological: She feels "good, but I miss school and my kids". She is ready for covid-19 to be over.  Mental: Becky Martinez is still doing well in terms of her abilities to think, to pay attention, to remember, and to make decisions GYN: LMP occurred this past week. Periods occur irregularly and are changing in terms of duration of flow and pain level.      PAST MEDICAL, FAMILY, AND SOCIAL HISTORY:  No past medical history on file.  No family history on file.   Current Outpatient Medications:  .  ferrous sulfate 325 (65 FE) MG tablet, TAKE 1 TABLET (325 MG TOTAL) BY MOUTH DAILY., Disp: 90 tablet, Rfl: 3 .  SYNTHROID 125 MCG tablet, TAKE TWO TABLETS DAILY., Disp: 180 tablet, Rfl: 2  Allergies as of 10/02/2018  . (No Known Allergies)    1. 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 She finished her master's degree in education in August 2016.  2. Activities: Daily exercise, helping her mother 3. Smoking, alcohol, or drugs: None 4. Primary Care Provider: [facility] [place]: There are no other significant problems involving Becky Martinez's other body systems.   Objective:  Vital Signs:  BP 109/71   Pulse 66   Wt 262 lb 12.8 oz (119.2 kg)   BMI 44.32 kg/m    Ht Readings from Last 3 Encounters:  07/09/17 5' 4.57" (1.64 m)   Wt Readings from Last 3 Encounters:  10/02/18 262 lb 12.8 oz (119.2 kg)  07/09/17 254 lb 6.4 oz (115.4 kg)  01/08/17 235 lb 6.4 oz (106.8 kg)    PHYSICAL EXAM:  Constitutional: The patient looks really good today. She is  bright and alert. Her affect and insight are good. She has gained 8 pounds since her last visit.    Face: Her face appears normal.  Eyes: There is no obvious arcus or proptosis. Moisture appears normal. Mouth: The oropharynx and tongue appear normal. Oral moisture is normal. Neck: The neck appears to be visibly normal. No carotid bruits are noted. The thyroid gland is absent. The previous induration of her left strap muscle has resolved. She has no palpable cervical nodes or masses in the thyroid bed. Her supraclavicular areas are clear. She does not have any significant lymphadenopathy.  Lungs: Clear, moves air well Heart: Regular rate and rhythm, Normal S1 and S2 Abdomen; Enlarged, soft, non-tender Hands: No tremor, normal palms  Legs: No edema Neuro: 5+ strength in the UEs and LEs. Sensation to touch is intact in both legs.  Scalp: She does not have any areas of thinning.  LAB DATA:  Labs 10/02/18: HbA1c 5.0%, CBG 94;   Labs 07/03/17: HbA1c 5.0%; TSH 0.01, free T4 1.9, free T3 2.6, thyroglobulin <0.1, thyroglobulin antibody <1  Labs 04/16/17: TSH 0.02, free T4 1.9, free T3 2.8  Labs  01/08/17: HbA1c 5.2%, CBG 90  Labs 07/04/16: HbA1c 4.9%, CBG 70  Labs 06/05/16: TSH 3.54, free T4 1.2, free T3 2.2, thyroglobulin <0.1, thyroglobulin antibody <1  Labs 12/09/15: HbA1c 4.9%; TSH 0.01, free T4 2.4, free T3 3.5  Labs 05/29/15: HbA1c 5.3%; TSH 0.01, free T4 3.4, free T3 5.4, thyroglobulin <0.1, thyroglobulin antibody <1, TPO antibody 7  Labs 12/15/14: HbA1c 4.9%; other labs pending  Labs 06/08/14; HbA1c 5.4%; TSH < 0.008, free T4 2.83, free T3 4.4, thyroglobulin <0.1, anti-thyroglobulin antibody <1  Labs 12/10/13: HbA1c 5.0%  Lab 06/03/13: HbA1c 5.4%; TSH 0.017, free T4 2.81, free T3 4.3, TPO antibody 11.2, thyroglobulin < 0.2, Tg antibody < 20  Lab 01/24/12: TSH 45.646, free T4 0.78, free T3 1.9, thyroglobulin < 0.2, Tg antibody < 20  Lab 11/10/10: TSH 4.896, free T4 1.50, free T3 2.4  Lab  06/26/10: Thyroglobulin < 0.2, thyroglobulin antibody < 20, TSH 0.199, free T4 1.71, free T3 2.7, HbA1c 4.7%   Assessment and Plan:   ASSESSMENT:  1-3. Thyroid cancer/post-surgical hypothyroidism/iatrogenic hyperthyroidism:   A. She has no clinical evidence for recurrence of thyroid cancer. Her thyroglobulin levels through April 2019 have been below the lower limit of detection of the assay ("unmeasurable") since we began measuring them at her first visit with me in 2006.  It appears that her thyroid cancer has been cured.  Her risk of having a recurrence of thyroid cancer is very, very low.   B. Since her thyroglobulin was very good in September 2017, but her TSH was still very suppressed, I reduced her Synthroid dose by about 7%. Since then we have adjusted her Synthroid dose several more times, to include educing the dose in April 2019. I will review her thyroid tests performed to day and decide on whether any further dose adjustments are needed.  4. Obesity: She had lost 57 pounds in the 12 months prior to her April 2018 visit, but regained 24 pounds since then. She has not been exercising as much as she knows that she needs to do.  5. Hypertension: Her BP is good today. Walking more and losing more fat weight will help to control the BP over time.   6. Pre-diabetes: Her HbA1c had decreased to the mid-range when she was actively losing weight, increased later, but has decreased again, in part due to her prior weight loss. Her current HbA1c is mid-normal for a healthy 53 year-old woman.  7. Hair loss: She does not have any scalp thinning per se.  She no longer taking either Centrum for Women or One-a-Day for Women. 8. Irregular menses: She sees to be in the early phase of menopause   PLAN:  1. Diagnostic: Repeat TFTs, thyroglobulin antibody, and thyroglobulin today. Repeat TFTs, thyroglobulin, and thyroglobulin antibody in 12 months.   2. Therapeutic: Continue Synthroid doses of: 250 mcg/day (two  of the 125 mcg tablets per day) for 3 days per week, for example Monday-Wednesday-Friday, but take only 1.5 tablets per day for the other 4 days each week.  Adjust the Synthroid dose as needed to maintain the TSH level in the 1.0-2.0 range.  3. Patient education: We discussed the issues of thyroid cancer, pre-diabetes, hypertension, obesity, and hair loss at length.  4. Follow up 6 months  Level of Service: This visit lasted in excess of 50 minutes. More than 50% of the visit was devoted to counseling.  Tillman Sers, MD, CDE Adult and Pediatric Endocrinology 10/02/2018 3:18 PM

## 2018-10-03 LAB — THYROGLOBULIN LEVEL: Thyroglobulin: <0.1 ng/mL — ABNORMAL LOW

## 2018-10-03 LAB — T3, FREE: T3, Free: 2.7 pg/mL (ref 2.3–4.2)

## 2018-10-03 LAB — T4, FREE: Free T4: 1.7 ng/dL (ref 0.8–1.8)

## 2018-10-03 LAB — TSH: TSH: 0.02 mIU/L — ABNORMAL LOW

## 2018-10-03 LAB — THYROGLOBULIN ANTIBODY: Thyroglobulin Ab: <1 IU/mL (ref <=1)

## 2018-10-04 ENCOUNTER — Other Ambulatory Visit: Payer: Self-pay | Admitting: Family Medicine

## 2018-10-04 ENCOUNTER — Other Ambulatory Visit (HOSPITAL_COMMUNITY)
Admission: RE | Admit: 2018-10-04 | Discharge: 2018-10-04 | Disposition: A | Discharge status: Home / Self Care | Payer: BC Managed Care – PPO | Source: Ambulatory Visit | Attending: Family Medicine | Admitting: Family Medicine

## 2018-10-04 DIAGNOSIS — Z0000 Encounter for general adult medical examination without abnormal findings: Secondary | ICD-10-CM | POA: Insufficient documentation

## 2018-10-08 LAB — CYTOLOGY - PAP: Diagnosis: NEGATIVE

## 2018-10-24 ENCOUNTER — Encounter (INDEPENDENT_AMBULATORY_CARE_PROVIDER_SITE_OTHER): Payer: Self-pay | Admitting: *Deleted

## 2018-10-24 ENCOUNTER — Telehealth (INDEPENDENT_AMBULATORY_CARE_PROVIDER_SITE_OTHER): Payer: Self-pay | Admitting: *Deleted

## 2018-10-24 NOTE — Telephone Encounter (Signed)
Spoke to patient, advised that per Dr. Tobe Sos: Thyroid tests are still hyperthyroid. Thyroglobulin and thyroglobulin antibody are both too low to measure. Because there are no signs of thyroid cancer recurrence, we can safely taper her Synthroid dosage. Please change Synthroid to two of the 125 mcg tablets per day for one day each week, but take 1.5 of the 125 mcg tablets per day on the other 6 days each week.  She expressed understanding of the change. I will also send this to her mychart.

## 2018-12-22 ENCOUNTER — Other Ambulatory Visit (INDEPENDENT_AMBULATORY_CARE_PROVIDER_SITE_OTHER): Payer: Self-pay | Admitting: "Endocrinology

## 2019-01-28 ENCOUNTER — Other Ambulatory Visit: Payer: Self-pay

## 2019-01-28 DIAGNOSIS — Z20822 Contact with and (suspected) exposure to covid-19: Secondary | ICD-10-CM

## 2019-01-30 LAB — NOVEL CORONAVIRUS, NAA: SARS-CoV-2, NAA: NOT DETECTED

## 2019-01-30 LAB — SPECIMEN STATUS REPORT

## 2019-03-17 ENCOUNTER — Other Ambulatory Visit (INDEPENDENT_AMBULATORY_CARE_PROVIDER_SITE_OTHER): Payer: Self-pay | Admitting: "Endocrinology

## 2019-03-17 DIAGNOSIS — E0580 Other thyrotoxicosis without thyrotoxic crisis or storm: Secondary | ICD-10-CM

## 2019-03-17 DIAGNOSIS — E890 Postprocedural hypothyroidism: Secondary | ICD-10-CM

## 2019-05-24 ENCOUNTER — Ambulatory Visit: Payer: BC Managed Care – PPO | Attending: Internal Medicine

## 2019-05-24 DIAGNOSIS — Z23 Encounter for immunization: Secondary | ICD-10-CM | POA: Insufficient documentation

## 2019-05-24 NOTE — Progress Notes (Signed)
   Covid-19 Vaccination Clinic  Name:  Becky Martinez    MRN: [id number] DOB: Nov 18, 1965  05/24/2019  [name] was observed post Covid-19 immunization for 15 minutes without incidence. She was provided with Vaccine Information Sheet and instruction to access the V-Safe system.   [name] was instructed to call 911 with any severe reactions post vaccine: Marland Kitchen Difficulty breathing  . Swelling of your face and throat  . A fast heartbeat  . A bad rash all over your body  . Dizziness and weakness    Immunizations Administered    Name Date Dose VIS Date Route   Pfizer COVID-19 Vaccine 05/24/2019  4:57 PM 0.3 mL 03/07/2019 Intramuscular   Manufacturer: [place]   Lot: [id number]   [place]: [id number]

## 2019-06-14 ENCOUNTER — Ambulatory Visit: Payer: BC Managed Care – PPO | Attending: Internal Medicine

## 2019-06-14 DIAGNOSIS — Z23 Encounter for immunization: Secondary | ICD-10-CM

## 2019-06-14 NOTE — Progress Notes (Signed)
   Covid-19 Vaccination Clinic  Name:  Becky Martinez    MRN: [id number] DOB: [birth year]  06/14/2019  Becky Martinez was observed post Covid-19 immunization for 15 minutes without incident. She was provided with Vaccine Information Sheet and instruction to access the V-Safe system.   Becky Martinez was instructed to call 911 with any severe reactions post vaccine: Marland Kitchen Difficulty breathing  . Swelling of face and throat  . A fast heartbeat  . A bad rash all over body  . Dizziness and weakness   Immunizations Administered    Name Date Dose VIS Date Route   Pfizer COVID-19 Vaccine 06/14/2019  4:21 PM 0.3 mL 03/07/2019 Intramuscular   Manufacturer: [place]   Lot: [id number]   [place]: [id number]

## 2019-09-29 ENCOUNTER — Telehealth (INDEPENDENT_AMBULATORY_CARE_PROVIDER_SITE_OTHER): Payer: Self-pay | Admitting: "Endocrinology

## 2019-09-29 DIAGNOSIS — E890 Postprocedural hypothyroidism: Secondary | ICD-10-CM

## 2019-09-29 DIAGNOSIS — C73 Malignant neoplasm of thyroid gland: Secondary | ICD-10-CM

## 2019-09-29 NOTE — Telephone Encounter (Signed)
Lab work put in. [name] notified.

## 2019-09-29 NOTE — Telephone Encounter (Signed)
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 Tobe Sos  Reason for call: Would like to know if she needs blood work done before appointment and if the orders have been placed yet.   Call ID:      PRESCRIPTION REFILL ONLY  Name of prescription:  Pharmacy:

## 2019-09-30 LAB — T4, FREE: Free T4: 2.4 ng/dL — ABNORMAL HIGH (ref 0.8–1.8)

## 2019-09-30 LAB — T3, FREE: T3, Free: 3.8 pg/mL (ref 2.3–4.2)

## 2019-09-30 LAB — TSH: TSH: <0.01 mIU/L — ABNORMAL LOW

## 2019-10-02 ENCOUNTER — Encounter (INDEPENDENT_AMBULATORY_CARE_PROVIDER_SITE_OTHER): Payer: Self-pay | Admitting: "Endocrinology

## 2019-10-02 ENCOUNTER — Ambulatory Visit (INDEPENDENT_AMBULATORY_CARE_PROVIDER_SITE_OTHER): Payer: BC Managed Care – PPO | Admitting: "Endocrinology

## 2019-10-02 ENCOUNTER — Other Ambulatory Visit: Payer: Self-pay

## 2019-10-02 VITALS — BP 112/68 | HR 92 | Wt 240.4 lb

## 2019-10-02 DIAGNOSIS — C73 Malignant neoplasm of thyroid gland: Secondary | ICD-10-CM

## 2019-10-02 DIAGNOSIS — E890 Postprocedural hypothyroidism: Secondary | ICD-10-CM

## 2019-10-02 DIAGNOSIS — E0580 Other thyrotoxicosis without thyrotoxic crisis or storm: Secondary | ICD-10-CM | POA: Diagnosis not present

## 2019-10-02 DIAGNOSIS — N926 Irregular menstruation, unspecified: Secondary | ICD-10-CM

## 2019-10-02 DIAGNOSIS — L659 Nonscarring hair loss, unspecified: Secondary | ICD-10-CM

## 2019-10-02 DIAGNOSIS — R7303 Prediabetes: Secondary | ICD-10-CM

## 2019-10-02 DIAGNOSIS — I10 Essential (primary) hypertension: Secondary | ICD-10-CM

## 2019-10-02 LAB — POCT GLYCOSYLATED HEMOGLOBIN (HGB A1C): Hemoglobin A1C: 5.1 % (ref 4.0–5.6)

## 2019-10-02 LAB — POCT GLUCOSE (DEVICE FOR HOME USE): POC Glucose: 91 mg/dl (ref 70–99)

## 2019-10-02 MED ORDER — LEVOTHYROXINE SODIUM 150 MCG PO TABS
ORAL_TABLET | ORAL | 3 refills | Status: DC
Start: 2019-10-02 — End: 2020-01-06

## 2019-10-02 NOTE — Progress Notes (Signed)
Subjective:  Patient Name: Becky Martinez Date of Birth: 04-27-1965  MRN: [id number]  Becky Martinez  presents to the office today for follow up of her papillary thyroid cancer, post-surgical hypothyroidism, iatrogenic hyperthyroidism, morbid obesity, pre-diabetes, and hypertension.  HISTORY OF PRESENT ILLNESS:   Becky Martinez is a 54 y.o. Caucasian woman.  [name] was unaccompanied.  1. The patient developed thyroid nodules in 2002. A thyroid ultrasound on 01/03/01 showed a multinodular goiter with a dominant nodule in the right lobe measuring 21 x 21 x 29 mm. On 02/15/01 she underwent a total thyroidectomy. 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 There was no microscopic evidence of extension of the tumor beyond the capsule. In the left lobe a 0.8 cm focus of papillary thyroid cancer was noted. No lymphovascular invasion or extracapsular extension was noted. An I-131 scan performed on 05/11/01 showed residual uptake in the thyroid bed. On 05/27/01 she received a therapeutic I-131 dose of 40.3 mCi. 3. On 09/18/02 she had a follow up I-131 nuclear scan. This study demonstrated residual activity in the right thyroid bed, compatible with residual functioning thyroid tissue and/or tumor. On 10/01/02 she received a 27 mCi dose of I-131. On 06/26/03 she had a follow up I-131 scan which showed some residual activity in the right thyroid bed area. On 07/01/03 she received 100 mCi of I-131. Follow up I-131 scan on 01/18/04 showed no residual thyroid bed activity.  2. I assumed her care in 2006. Follow up I-131 scan on 01/03/05 showed no residual thyroid bed activity. After ensuring that her TSH was then appropriately suppressed by supraphysiologic doses of Synthroid, I ordered a Thyrogen-stimulated I-131 scan on 01/19/06. This study again showed no evidence of residual thyroid bed activity. Since then I have followed her with serial thyroglobulin  measurements and anti-thyroglobulin antibody measurements. Her thyroglobulin values have consistently been below the lower limit of detection of the assay. Her anti-thyroglobulin antibody values have consistently been below 20.  3. The patient's last [facility] visit was on 10/02/18:    A. At that visit I changed her Synthroid dosage to 250 mcg/day (two 125 mcg tablets) for 3 days each week and 1.5 of the 125 mcg pills on four days each week.   B. In the interim she has been healthy.   C. Her menses are more irregular now. LMP was in May and she had missed menses for about 4-5 months prior to then. Sometimes she has headache, sometimes nausea and vomiting associated with her menses, but not as often.      D. She has been walking 3-5 times per week. She has been cutting back on carbs and calories.   E. She is also taking ferrous sulfate, one 325 mg tablet each evening.   F. She developed a rash after her second [place] vaccination. She is using a steroid cream and the rash is resolving.   3.. Constitutional. [name] feels "pretty good". She still has hair loss. Energy level is "good". She usually sleeps well. She pretty much only drinks water, but does drink coffee in the mornings. Her husband says that she snores sometimes, but less often. She no longer gets cold. Her weight has decreased 22 pounds in the past 12 months, equivalent to deficit of about 200 calories per day.  Eyes: Becky Martinez's vision is slowly changing, c/w presbyopia. She now wears prescription glasses all the time. There are no other significant problems with her eyes.  Neck: Becky Martinez is not aware of any problems relating to the anterior neck and thyroid bed. There have not been any  significant problems of swelling, pain, soreness, tenderness, pressure, discomfort, or difficulty swallowing. Heart: Becky Martinez feels the expected increase in heart rate during exercise or other physical activities. There have been no significant problems with palpitations,  irregular heart beats, chest pain, or chest pressure. Gastrointestinal: She does not have much belly hunger. Stomach and intestines seem to be working normally. Bowel movements are normal. There are no significant complaints of excessive hunger, acid reflux, upset stomach, stomach aches or pains, diarrhea, or constipation. Musculoskeletal:  Muscles and extremities appear to be working normally. There are no significant problems with hand tremor, sweaty palms, palmar erythema, or lower leg swelling. Psychological: She feels "pretty good". "Everything's fine. I'm pretty much happy all the time."  Mental: [name] is still doing well in terms of her abilities to think, to pay attention, to remember, and to make decisions GYN: As above  PAST MEDICAL, FAMILY, AND SOCIAL HISTORY:  No past medical history on file.  No family history on file.   Current Outpatient Medications:  .  ferrous sulfate 325 (65 FE) MG tablet, TAKE 1 TABLET (325 MG TOTAL) BY MOUTH DAILY., Disp: 90 tablet, Rfl: 3 .  SYNTHROID 125 MCG tablet, TAKE 2 TABLETS BY MOUTH EVERY DAY, Disp: 180 tablet, Rfl: 1  Allergies as of 10/02/2019  . (No Known Allergies)    1. 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 She finished her master's degree in education in August 2016.  2. Activities: Daily exercise, helping her mother 3. Smoking, alcohol, or drugs: None 4. Primary Care Provider: [facility]. [facility] [place]: There are no other significant problems involving Becky Martinez's other body systems.   Objective:  Vital Signs:  BP 112/68   Pulse 92   Wt 240 lb 6.4 oz (109 kg)   BMI 40.54 kg/m    Ht Readings from Last 3 Encounters:  07/09/17 5' 4.57" (1.64 m)   Wt Readings from Last 3 Encounters:  10/02/19 240 lb 6.4 oz (109 kg)  10/02/18 262 lb 12.8 oz (119.2 kg)  07/09/17 254 lb 6.4 oz (115.4 kg)    PHYSICAL EXAM:  Constitutional: The patient looks really good  today. She is bright and alert. Her affect and insight are good. She has lost 22 pounds since her last visit.    Face: Her face appears normal.  Eyes: There is no obvious arcus or proptosis. Moisture appears normal. Mouth: The oropharynx and tongue appear normal. Oral moisture is normal. Neck: The neck appears to be visibly normal. No carotid bruits are noted. The thyroid gland is absent. The previous induration of her left strap muscle has resolved. She has no palpable cervical nodes or masses in the thyroid bed. Her supraclavicular areas are clear. She does not have any significant lymphadenopathy.  Lungs: Clear, moves air well Heart: Regular rate and rhythm, Normal S1 and S2 Abdomen: Enlarged, but smaller; soft, non-tender Hands: No tremor, normal palms  Legs: No edema Neuro: 5+ strength in the UEs and LEs. Sensation to touch is intact in both legs.  Scalp: She does not have any areas of thinning.  LAB DATA:  Labs 10/02/19: HbA1c 5.1%, CBG 91  Labs 09/30/19: TSH <0.01, free T4 2.4 (ref 0.8-1.8),  free T3 3.8  Labs 09/09/19: TSH  0.008, free T4 3.0  Labs 10/02/18:  HbA1c 5.0%, CBG 94; TSH 0.02, free T4 1.7, free T3 2.7, thyroglobulin <0.1 (ref < 0.1), thyroglobulin antibody <1 (ref < or = 1)  Labs 07/03/17: HbA1c 5.0%; TSH 0.01, free T4 1.9, free T3 2.6, thyroglobulin <0.1, thyroglobulin antibody <1  Labs 04/16/17: TSH 0.02, free T4 1.9, free T3 2.8  Labs 01/08/17: HbA1c 5.2%, CBG 90  Labs 07/04/16: HbA1c 4.9%, CBG 70  Labs 06/05/16: TSH 3.54, free T4 1.2, free T3 2.2, thyroglobulin <0.1, thyroglobulin antibody <1  Labs 12/09/15: HbA1c 4.9%; TSH 0.01, free T4 2.4, free T3 3.5  Labs 05/29/15: HbA1c 5.3%; TSH 0.01, free T4 3.4, free T3 5.4, thyroglobulin <0.1, thyroglobulin antibody <1, TPO antibody 7  Labs 12/15/14: HbA1c 4.9%; other labs pending  Labs 06/08/14; HbA1c 5.4%; TSH < 0.008, free T4 2.83, free T3 4.4, thyroglobulin <0.1, anti-thyroglobulin antibody <1  Labs 12/10/13: HbA1c  5.0%  Lab 06/03/13: HbA1c 5.4%; TSH 0.017, free T4 2.81, free T3 4.3, TPO antibody 11.2, thyroglobulin < 0.2, Tg antibody < 20  Lab 01/24/12: TSH 45.646, free T4 0.78, free T3 1.9, thyroglobulin < 0.2, Tg antibody < 20  Lab 11/10/10: TSH 4.896, free T4 1.50, free T3 2.4  Lab 06/26/10: Thyroglobulin < 0.2, thyroglobulin antibody < 20, TSH 0.199, free T4 1.71, free T3 2.7, HbA1c 4.7%   Assessment and Plan:   ASSESSMENT:  1-3. Thyroid cancer/post-surgical hypothyroidism/iatrogenic hyperthyroidism:   A. She has no clinical evidence for recurrence of thyroid cancer. Her thyroglobulin levels through July 2020 have been below the lower limit of detection of the assay ("unmeasurable") since we began measuring them at her first visit with me in 2006.  It appears that her thyroid cancer has been cured.  Her risk of having a recurrence of thyroid cancer is very, very low.   B. Since her thyroglobulin was very good in September 2017, but her TSH was still very suppressed, I reduced her Synthroid dose by about 7%. Since then we have adjusted her Synthroid dose several more times, to include reducing the dose in April 2019.   C. Since last visit she has become profoundly hyperthyroid. Ironically she has felt quite normal and happy that her weight loss efforts have been successful. She is clinically euthyroid.   4. Obesity:   A. She had lost 57 pounds in the 12 months prior to her April 2018 visit, but regained 24 pounds thereafter.   B. At her July 2020 visit she was walking. Since then she has reduced her intake of both carbs and calories, but has not been exercising as much. The hyperthyroidism is a factor in her weight loss.   5. Hypertension: Her BP is good today. Walking more and losing more fat weight will help to control the BP over time.   6. Pre-diabetes: Her HbA1c had decreased to the mid-range when she was actively losing weight, increased later, but has decreased again, in part due to her weight  loss. Her current HbA1c is mid-normal for a healthy 54 year-old woman.  7. Hair loss: She does not have any scalp thinning per se.  She no longer takes either Centrum for Women or One-a-Day for Women. 8. Irregular menses: She sees to be in the early phase of menopause   PLAN:  1. Diagnostic: Repeat TFTs, thyroglobulin antibody, and thyroglobulin in 2 months.   2. Therapeutic: Reduce her Synthroid dose by 10% to 150 mcg/day. To use up the 125 mcg pills she has, take one pill per day for 6 days each week,  but on Sundays take two pills. Adjust the Synthroid dose as needed to maintain the TSH level in the 1.0-2.0 range.  3. Patient education: We discussed the issues of thyroid cancer, hyperthyroidism, pre-diabetes, hypertension, obesity, and hair loss at length.  4. Follow up 3 months  Level of Service: This visit lasted in excess of 55 minutes. More than 50% of the visit was devoted to counseling.  Tillman Sers, MD, CDE Adult and Pediatric Endocrinology 10/02/2019 3:06 PM

## 2019-10-02 NOTE — Patient Instructions (Addendum)
Follow up visit in 3 months. Please repeat lab tests in two months. To finish up the 125 mcg tablets, take one tablet per day for 6 days each week, but take two tablets on Sundays. Please repeat lab tests in two months.

## 2019-10-13 ENCOUNTER — Other Ambulatory Visit: Payer: Self-pay | Admitting: Family Medicine

## 2019-10-13 DIAGNOSIS — Z1231 Encounter for screening mammogram for malignant neoplasm of breast: Secondary | ICD-10-CM

## 2019-10-22 ENCOUNTER — Ambulatory Visit
Admission: RE | Admit: 2019-10-22 | Discharge: 2019-10-22 | Disposition: A | Discharge status: Home / Self Care | Payer: BC Managed Care – PPO | Source: Ambulatory Visit | Attending: Family Medicine | Admitting: Family Medicine

## 2019-10-22 DIAGNOSIS — Z1231 Encounter for screening mammogram for malignant neoplasm of breast: Secondary | ICD-10-CM

## 2019-11-03 ENCOUNTER — Other Ambulatory Visit (INDEPENDENT_AMBULATORY_CARE_PROVIDER_SITE_OTHER): Payer: Self-pay | Admitting: "Endocrinology

## 2019-11-03 DIAGNOSIS — E0580 Other thyrotoxicosis without thyrotoxic crisis or storm: Secondary | ICD-10-CM

## 2019-11-03 DIAGNOSIS — E890 Postprocedural hypothyroidism: Secondary | ICD-10-CM

## 2019-12-12 LAB — THYROGLOBULIN LEVEL: Thyroglobulin: <0.1 ng/mL — ABNORMAL LOW

## 2019-12-12 LAB — T4, FREE: Free T4: 1.9 ng/dL — ABNORMAL HIGH (ref 0.8–1.8)

## 2019-12-12 LAB — TSH: TSH: 0.01 mIU/L — ABNORMAL LOW

## 2019-12-12 LAB — T3, FREE: T3, Free: 2.7 pg/mL (ref 2.3–4.2)

## 2019-12-12 LAB — THYROGLOBULIN ANTIBODY: Thyroglobulin Ab: <1 IU/mL (ref <=1)

## 2019-12-16 ENCOUNTER — Encounter (INDEPENDENT_AMBULATORY_CARE_PROVIDER_SITE_OTHER): Payer: Self-pay | Admitting: *Deleted

## 2019-12-29 ENCOUNTER — Other Ambulatory Visit (INDEPENDENT_AMBULATORY_CARE_PROVIDER_SITE_OTHER): Payer: Self-pay | Admitting: "Endocrinology

## 2019-12-29 DIAGNOSIS — E890 Postprocedural hypothyroidism: Secondary | ICD-10-CM

## 2019-12-29 DIAGNOSIS — E0580 Other thyrotoxicosis without thyrotoxic crisis or storm: Secondary | ICD-10-CM

## 2019-12-31 ENCOUNTER — Other Ambulatory Visit (INDEPENDENT_AMBULATORY_CARE_PROVIDER_SITE_OTHER): Payer: Self-pay | Admitting: "Endocrinology

## 2019-12-31 DIAGNOSIS — E0580 Other thyrotoxicosis without thyrotoxic crisis or storm: Secondary | ICD-10-CM

## 2019-12-31 DIAGNOSIS — E890 Postprocedural hypothyroidism: Secondary | ICD-10-CM

## 2020-01-02 ENCOUNTER — Telehealth (INDEPENDENT_AMBULATORY_CARE_PROVIDER_SITE_OTHER): Payer: Self-pay | Admitting: "Endocrinology

## 2020-01-02 ENCOUNTER — Encounter (INDEPENDENT_AMBULATORY_CARE_PROVIDER_SITE_OTHER): Payer: Self-pay

## 2020-01-02 NOTE — Telephone Encounter (Signed)
°  Who's calling (name and relationship to patient) : Becky Martinez (self)  Best contact number: [phone]  Provider they see: Dr. Tobe Sos  Reason for call: Patient states that when she picked up her refill she was given the generic and in the past Dr. Tobe Sos has advised her not to take the generic. She requests a call back for clarification.    PRESCRIPTION REFILL ONLY  Name of prescription: levothyroxine (SYNTHROID) 150 MCG tablet Pharmacy: [id number] - [place], [place] - [place] [place]

## 2020-01-02 NOTE — Telephone Encounter (Signed)
Mychart message sent.

## 2020-01-06 ENCOUNTER — Other Ambulatory Visit (INDEPENDENT_AMBULATORY_CARE_PROVIDER_SITE_OTHER): Payer: Self-pay

## 2020-01-06 MED ORDER — LEVOTHYROXINE SODIUM 150 MCG PO TABS
ORAL_TABLET | ORAL | 3 refills | Status: DC
Start: 2020-01-06 — End: 2021-05-09

## 2020-01-07 ENCOUNTER — Ambulatory Visit (INDEPENDENT_AMBULATORY_CARE_PROVIDER_SITE_OTHER): Payer: BC Managed Care – PPO | Admitting: "Endocrinology

## 2020-03-04 ENCOUNTER — Ambulatory Visit (INDEPENDENT_AMBULATORY_CARE_PROVIDER_SITE_OTHER): Payer: BC Managed Care – PPO | Admitting: "Endocrinology

## 2020-03-04 ENCOUNTER — Other Ambulatory Visit: Payer: Self-pay

## 2020-03-04 ENCOUNTER — Encounter (INDEPENDENT_AMBULATORY_CARE_PROVIDER_SITE_OTHER): Payer: Self-pay | Admitting: "Endocrinology

## 2020-03-04 VITALS — BP 118/70 | HR 84 | Wt 227.0 lb

## 2020-03-04 DIAGNOSIS — E890 Postprocedural hypothyroidism: Secondary | ICD-10-CM | POA: Diagnosis not present

## 2020-03-04 DIAGNOSIS — L659 Nonscarring hair loss, unspecified: Secondary | ICD-10-CM

## 2020-03-04 DIAGNOSIS — C73 Malignant neoplasm of thyroid gland: Secondary | ICD-10-CM

## 2020-03-04 DIAGNOSIS — R7303 Prediabetes: Secondary | ICD-10-CM

## 2020-03-04 DIAGNOSIS — I10 Essential (primary) hypertension: Secondary | ICD-10-CM

## 2020-03-04 LAB — POCT GLYCOSYLATED HEMOGLOBIN (HGB A1C): Hemoglobin A1C: 5.2 % (ref 4.0–5.6)

## 2020-03-04 LAB — POCT GLUCOSE (DEVICE FOR HOME USE): POC Glucose: 83 mg/dl (ref 70–99)

## 2020-03-04 NOTE — Patient Instructions (Signed)
Follow up visit in 3 months. 

## 2020-03-04 NOTE — Progress Notes (Signed)
Subjective:  Patient Name: Becky Martinez Date of Birth: 1965-07-06  MRN: [id number]  Becky Martinez  presents to the office today for follow up of her papillary thyroid cancer, post-surgical hypothyroidism, iatrogenic hyperthyroidism, morbid obesity, pre-diabetes, and hypertension.  HISTORY OF PRESENT ILLNESS:   Becky Martinez is a 54 y.o. Caucasian woman.  Becky Martinez was unaccompanied.  1. The patient developed thyroid nodules in 2002. A thyroid ultrasound on 01/03/01 showed a multinodular goiter with a dominant nodule in the right lobe measuring 21 x 21 x 29 mm. On 02/15/01 she underwent a total thyroidectomy. 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 There was no microscopic evidence of extension of the tumor beyond the capsule. In the left lobe a 0.8 cm focus of papillary thyroid cancer was noted. No lymphovascular invasion or extracapsular extension was noted. An I-131 scan performed on 05/11/01 showed residual uptake in the thyroid bed. On 05/27/01 she received a therapeutic I-131 dose of 40.3 mCi. 3. On 09/18/02 she had a follow up I-131 nuclear scan. This study demonstrated residual activity in the right thyroid bed, compatible with residual functioning thyroid tissue and/or tumor. On 10/01/02 she received a 27 mCi dose of I-131. On 06/26/03 she had a follow up I-131 scan which showed some residual activity in the right thyroid bed area. On 07/01/03 she received 100 mCi of I-131. Follow up I-131 scan on 01/18/04 showed no residual thyroid bed activity.  2. I assumed her care in 2006. Follow up I-131 scan on 01/03/05 showed no residual thyroid bed activity. After ensuring that her TSH was then appropriately suppressed by supraphysiologic doses of Synthroid, I ordered a Thyrogen-stimulated I-131 scan on 01/19/06. This study again showed no evidence of residual thyroid bed activity. Since then I have followed her with serial thyroglobulin  measurements and anti-thyroglobulin antibody measurements. Her thyroglobulin values have consistently been below the lower limit of detection of the assay. Her anti-thyroglobulin antibody values have consistently been below 20.  3. The patient's last [facility] visit was on 10/02/19: I reduced her Synthroid dose to 150 mcg/day. Later however, after reviewing her lab results in September, I reduced her Synthroid to 150 mcg/day for 6 days each week, skipping the seventh days.  A. In the interim she has been healthy.   B. She continues to lose weight with a combination of walking and watching her calories and carbs. She has been walking 3-5 times per week.   C. Her menses have ceased. LMP was in May and she had missed menses for about 4-5 months prior to then.   D. She is also taking ferrous sulfate, one 325 mg tablet each evening.   E. She developed a rash after her second [place] vaccination. She has not yet had the booster.   3.. Constitutional. Becky Martinez feels "great". She still has a little hair loss. Energy level is "great". She usually sleeps well. She pretty much only drinks water, but does drink coffee in the mornings. Her husband says that she snores sometimes, but less often. She no longer gets unusually cold. Her weight has decreased another 13 pounds in the past 5 months, equivalent to deficit of about 290 calories per day.  Eyes: Becky Martinez's vision is slowly changing, c/w presbyopia. She now wears prescription glasses all the time. There are no other significant problems with her eyes.  Neck: Becky Martinez is not aware of any problems relating to the anterior neck and thyroid bed. There have  not been any  significant problems of swelling, pain, soreness, tenderness, pressure, discomfort, or difficulty swallowing. Heart: Becky Martinez feels the expected increase in heart rate during exercise or other physical activities. There have been no significant problems with palpitations, irregular heart beats, chest pain, or chest  pressure. Gastrointestinal: She does not have much belly hunger. Stomach and intestines seem to be working normally. Bowel movements are normal. There are no significant complaints of excessive hunger, acid reflux, upset stomach, stomach aches or pains, diarrhea, or constipation. Hands: No problems, no tremor, no sweating Legs: Normal muscle bulk and strength. No edema. Neuro: No problems with strength, sensation , or balance Psychological: She feels "great, I'm good".  Mental: Becky Martinez is still doing well in terms of her abilities to think, to pay attention, to remember, and to make decisions GYN: As above  PAST MEDICAL, FAMILY, AND SOCIAL HISTORY:  No past medical history on file.  No family history on file.   Current Outpatient Medications:  .  ferrous sulfate 325 (65 FE) MG tablet, TAKE 1 TABLET (325 MG TOTAL) BY MOUTH DAILY., Disp: 90 tablet, Rfl: 3 .  levothyroxine (SYNTHROID) 150 MCG tablet, Take one pill daily., Disp: 90 tablet, Rfl: 3  Allergies as of 03/04/2020 - Review Complete 10/02/2019  Allergen Reaction Noted  . Prednisone Other (See Comments) 12/29/2019    1. 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 She finished her master's degree in education in August 2016.  2. Activities: Daily exercise, helping her mother 3. Smoking, alcohol, or drugs: None 4. Primary Care Provider: [facility]. [facility] [place]: There are no other significant problems involving Becky Martinez's other body systems.   Objective:  Vital Signs:  BP 118/70   Pulse 84   Wt 227 lb (103 kg)   BMI 38.28 kg/m    Ht Readings from Last 3 Encounters:  07/09/17 5' 4.57" (1.64 m)   Wt Readings from Last 3 Encounters:  03/04/20 227 lb (103 kg)  10/02/19 240 lb 6.4 oz (109 kg)  10/02/18 262 lb 12.8 oz (119.2 kg)    PHYSICAL EXAM:  Constitutional: The patient looks really good today. She is bright and alert. Her affect and insight are  good. She has lost 13 pounds since her last visit. I almost didn't recognize her today.  Face: Her face appears normal.  Eyes: There is no obvious arcus or proptosis. Moisture appears normal. Mouth: The oropharynx and tongue appear normal. Oral moisture is normal. Neck: The neck appears to be visibly normal. No carotid bruits are noted. The thyroid gland is absent. The previous induration of her left strap muscle has resolved. She has no palpable cervical nodes or masses in the thyroid bed. Her supraclavicular areas are clear. She does not have any significant lymphadenopathy.  Lungs: Clear, moves air well Heart: Regular rate and rhythm, Normal S1 and S2 Abdomen: Enlarged, but smaller; soft, non-tender Hands: No tremor, normal palms  Legs: No edema Neuro: 5+ strength in the UEs and LEs. Sensation to touch is intact in both legs.  Scalp: She does not have any areas of thinning.  LAB DATA:  Labs 03/04/20: HbA1c 5.2%, CBG 83  Labs 12/11/19: TSH 0.01, free T4 1.9, free T3 2.7, thyroglobulin <0.1, thyroglobulin antibody <1  Labs 10/02/19: HbA1c 5.1%, CBG 91  Labs 09/30/19: TSH <0.01, free T4 2.4 (ref 0.8-1.8),  free T3 3.8  Labs 09/09/19: TSH  0.008, free T4 3.0  Labs 10/02/18: HbA1c 5.0%, CBG 94; TSH 0.02, free T4 1.7, free T3 2.7, thyroglobulin <0.1 (ref < 0.1), thyroglobulin antibody <1 (ref < or = 1)  Labs 07/03/17: HbA1c 5.0%; TSH 0.01, free T4 1.9, free T3 2.6, thyroglobulin <0.1, thyroglobulin antibody <1  Labs 04/16/17: TSH 0.02, free T4 1.9, free T3 2.8  Labs 01/08/17: HbA1c 5.2%, CBG 90  Labs 07/04/16: HbA1c 4.9%, CBG 70  Labs 06/05/16: TSH 3.54, free T4 1.2, free T3 2.2, thyroglobulin <0.1, thyroglobulin antibody <1  Labs 12/09/15: HbA1c 4.9%; TSH 0.01, free T4 2.4, free T3 3.5  Labs 05/29/15: HbA1c 5.3%; TSH 0.01, free T4 3.4, free T3 5.4, thyroglobulin <0.1, thyroglobulin antibody <1, TPO antibody 7  Labs 12/15/14: HbA1c 4.9%; other labs pending  Labs 06/08/14; HbA1c 5.4%; TSH <  0.008, free T4 2.83, free T3 4.4, thyroglobulin <0.1, anti-thyroglobulin antibody <1  Labs 12/10/13: HbA1c 5.0%  Lab 06/03/13: HbA1c 5.4%; TSH 0.017, free T4 2.81, free T3 4.3, TPO antibody 11.2, thyroglobulin < 0.2, Tg antibody < 20  Lab 01/24/12: TSH 45.646, free T4 0.78, free T3 1.9, thyroglobulin < 0.2, Tg antibody < 20  Lab 11/10/10: TSH 4.896, free T4 1.50, free T3 2.4  Lab 06/26/10: Thyroglobulin < 0.2, thyroglobulin antibody < 20, TSH 0.199, free T4 1.71, free T3 2.7, HbA1c 4.7%   Assessment and Plan:   ASSESSMENT:  1-3. Thyroid cancer/post-surgical hypothyroidism/iatrogenic hyperthyroidism:   A. She has no clinical evidence for recurrence of thyroid cancer. Her thyroglobulin levels through July 2020 have been below the lower limit of detection of the assay ("unmeasurable") since we began measuring them at her first visit with me in 2006.  It appears that her thyroid cancer has been cured.  Her risk of having a recurrence of thyroid cancer is very, very low.   B. Since her thyroglobulin was very good in September 2017, but her TSH was still very suppressed, I reduced her Synthroid dose by about 7%. Since then we have adjusted her Synthroid dose several more times, to include reducing the dose in September 2021.   C. Since last visit she has felt quite normal and happy that her weight loss efforts have been successful. She is clinically euthyroid.   4. Obesity:   A. She had lost 35 pounds in the 17 months.   B. She is really trying very hard and doing well.   5. Hypertension: Her BP is good today. Walking more and losing more fat weight will help to control the BP over time.   6. Pre-diabetes: Her HbA1c had decreased to the mid-range when she was actively losing weight, increased later, but has decreased again, in part due to her weight loss. Her current HbA1c is mid-normal for a healthy 54 year-old woman.  7. Hair loss: She does not have any scalp thinning per se.  I recommended that she  take either Centrum for Women or One-a-Day for Women. 8. Irregular menses: She sees to be in menopause   PLAN:  1. Diagnostic: Repeat TFTs today.    2. Therapeutic: Continue to take 150 mcg of Synthroid/ day for 6 days per week, skipping one day per week. Adjust the Synthroid dose as needed to maintain the TSH level in the 1.0-2.0 range.  3. Patient education: We discussed the issues of thyroid cancer, hyperthyroidism, pre-diabetes, hypertension, obesity, and hair loss at length.  4. Follow up 3 months  Level of Service: This visit lasted in excess of 50 minutes. More than 50% of the visit was  devoted to counseling.  Tillman Sers, MD, CDE Adult and Pediatric Endocrinology 03/04/2020 1:41 PM

## 2020-03-05 LAB — T3, FREE: T3, Free: 2.7 pg/mL (ref 2.3–4.2)

## 2020-03-05 LAB — TSH: TSH: 0.1 mIU/L — ABNORMAL LOW

## 2020-03-05 LAB — T4, FREE: Free T4: 1.6 ng/dL (ref 0.8–1.8)

## 2020-03-08 ENCOUNTER — Ambulatory Visit (INDEPENDENT_AMBULATORY_CARE_PROVIDER_SITE_OTHER): Payer: BC Managed Care – PPO | Admitting: "Endocrinology

## 2020-03-18 ENCOUNTER — Encounter (INDEPENDENT_AMBULATORY_CARE_PROVIDER_SITE_OTHER): Payer: Self-pay

## 2020-03-18 ENCOUNTER — Other Ambulatory Visit (INDEPENDENT_AMBULATORY_CARE_PROVIDER_SITE_OTHER): Payer: Self-pay

## 2020-03-18 MED ORDER — LEVOTHYROXINE SODIUM 112 MCG PO TABS
112.0000 ug | ORAL_TABLET | Freq: Every day | ORAL | 2 refills | Status: DC
Start: 2020-03-18 — End: 2020-08-27

## 2020-03-30 ENCOUNTER — Other Ambulatory Visit (INDEPENDENT_AMBULATORY_CARE_PROVIDER_SITE_OTHER): Payer: Self-pay | Admitting: "Endocrinology

## 2020-06-08 ENCOUNTER — Encounter (INDEPENDENT_AMBULATORY_CARE_PROVIDER_SITE_OTHER): Payer: Self-pay | Admitting: "Endocrinology

## 2020-06-08 ENCOUNTER — Other Ambulatory Visit: Payer: Self-pay

## 2020-06-08 ENCOUNTER — Ambulatory Visit (INDEPENDENT_AMBULATORY_CARE_PROVIDER_SITE_OTHER): Payer: BC Managed Care – PPO | Admitting: "Endocrinology

## 2020-06-08 VITALS — BP 124/70 | HR 74 | Wt 248.6 lb

## 2020-06-08 DIAGNOSIS — E669 Obesity, unspecified: Secondary | ICD-10-CM

## 2020-06-08 DIAGNOSIS — I10 Essential (primary) hypertension: Secondary | ICD-10-CM | POA: Diagnosis not present

## 2020-06-08 DIAGNOSIS — C73 Malignant neoplasm of thyroid gland: Secondary | ICD-10-CM | POA: Diagnosis not present

## 2020-06-08 DIAGNOSIS — R7303 Prediabetes: Secondary | ICD-10-CM | POA: Diagnosis not present

## 2020-06-08 DIAGNOSIS — E890 Postprocedural hypothyroidism: Secondary | ICD-10-CM

## 2020-06-08 LAB — POCT GLUCOSE (DEVICE FOR HOME USE): POC Glucose: 89 mg/dl (ref 70–99)

## 2020-06-08 LAB — POCT GLYCOSYLATED HEMOGLOBIN (HGB A1C): Hemoglobin A1C: 4.8 % (ref 4.0–5.6)

## 2020-06-08 NOTE — Progress Notes (Signed)
Subjective:  Patient Name: Becky Martinez Date of Birth: [birth year]  MRN: [id number]  Becky Martinez  presents to the office today for follow up of her papillary thyroid cancer, post-surgical hypothyroidism, iatrogenic hyperthyroidism, morbid obesity, pre-diabetes, and hypertension.  HISTORY OF PRESENT ILLNESS:   Becky Martinez is a 55 y.o. Caucasian woman.  Becky Martinez was unaccompanied.  1. The patient developed thyroid nodules in 2002. A thyroid ultrasound on 01/03/01 showed a multinodular goiter with a dominant nodule in the right lobe measuring 21 x 21 x 29 mm. On 02/15/01 she underwent a total thyroidectomy. 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 There was no microscopic evidence of extension of the tumor beyond the capsule. In the left lobe a 0.8 cm focus of papillary thyroid cancer was noted. No lymphovascular invasion or extracapsular extension was noted. An I-131 scan performed on 05/11/01 showed residual uptake in the thyroid bed. On 05/27/01 she received a therapeutic I-131 dose of 40.3 mCi. 3. On 09/18/02 she had a follow up I-131 nuclear scan. This study demonstrated residual activity in the right thyroid bed, compatible with residual functioning thyroid tissue and/or tumor. On 10/01/02 she received a 27 mCi dose of I-131. On 06/26/03 she had a follow up I-131 scan which showed some residual activity in the right thyroid bed area. On 07/01/03 she received 100 mCi of I-131. Follow up I-131 scan on 01/18/04 showed no residual thyroid bed activity.  2. I assumed her care in 2006. Follow up I-131 scan on 01/03/05 showed no residual thyroid bed activity. After ensuring that her TSH was then appropriately suppressed by supraphysiologic doses of Synthroid, I ordered a Thyrogen-stimulated I-131 scan on 01/19/06. This study again showed no evidence of residual thyroid bed activity. Since then I have followed her with serial thyroglobulin  measurements and anti-thyroglobulin antibody measurements. Her thyroglobulin values have consistently been below the lower limit of detection of the assay. Her anti-thyroglobulin antibody values have consistently been below 20.  3. The patient's last [facility] visit was on 03/04/20: I continued her Synthroid dosage of 150 mcg/day for 6 days each week, skipping the seventh days. However, after reviewing her lab results, I reduced her Synthroid dosage further to 112 mcg/day every day.   A. In the interim she has been healthy.   B. She has regained weight. She has been fairly strict with her diet, but has not been walking.   C. Her menses have ceased. LMP was in May 2021 and she had missed menses for about 4-5 months prior to then.   D. She is also taking ferrous sulfate, one 325 mg tablet each evening.   E. She developed a rash after her second [place] vaccination. She has not yet had the booster.   3.. Constitutional. Becky Martinez feels "great". She no longer has any hair loss.  Energy level is "good". She usually sleeps well. She pretty much only drinks water, but does drink coffee in the mornings. She no longer gets unusually cold. Her weight has increased another 21 pounds in the past 6 months, equivalent to deficit of about 370 calories per day.  Eyes: Becky Martinez's vision is slowly changing, c/w presbyopia. She now wears prescription glasses all the time. There are no other significant problems with her eyes.  Neck: Becky Martinez is not aware of any problems relating to the anterior neck and thyroid bed. She does sometimes have the sensation of food sticking in her throat. There have not  been any  significant problems of swelling, pain, soreness, tenderness, pressure, discomfort.  Heart: Becky Martinez feels the expected increase in heart rate during exercise or other physical activities. There have been no significant problems with palpitations, irregular heart beats, chest pain, or chest pressure. Gastrointestinal: She does not have  much belly hunger. Stomach and intestines seem to be working normally. Bowel movements are normal. There are no significant complaints of excessive hunger, acid reflux, upset stomach, stomach aches or pains, diarrhea, or constipation. Hands: No problems, no tremor, no sweating Legs: Normal muscle bulk and strength. No edema. Neuro: No problems with strength, sensation, or balance Psychological: She feels "great, I'm doing well".  Mental: Becky Martinez is still doing well in terms of her abilities to think, to pay attention, to remember, and to make decisions GYN: As above  PAST MEDICAL, FAMILY, AND SOCIAL HISTORY:  No past medical history on file.  No family history on file.   Current Outpatient Medications:  .  ferrous sulfate 325 (65 FE) MG tablet, TAKE 1 TABLET (325 MG TOTAL) BY MOUTH DAILY., Disp: 90 tablet, Rfl: 3 .  levothyroxine (SYNTHROID) 112 MCG tablet, Take 1 tablet (112 mcg total) by mouth daily before breakfast., Disp: 60 tablet, Rfl: 2 .  levothyroxine (SYNTHROID) 150 MCG tablet, Take one pill daily. (Patient not taking: Reported on 06/08/2020), Disp: 90 tablet, Rfl: 3  Allergies as of 06/08/2020 - Review Complete 06/08/2020  Allergen Reaction Noted  . Prednisone Other (See Comments) 12/29/2019    1. 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 She finished her master's degree in education in August 2016.  2. Activities: Daily exercise, helping her mother 3. Smoking, alcohol, or drugs: None 4. Primary Care Provider: Dr. [facility] [place]: There are no other significant problems involving Becky Martinez's other body systems.   Objective:  Vital Signs:  BP 124/70   Pulse 74   Wt 248 lb 9.6 oz (112.8 kg)   BMI 41.93 kg/m    Ht Readings from Last 3 Encounters:  07/09/17 5' 4.57" (1.64 m)   Wt Readings from Last 3 Encounters:  06/08/20 248 lb 9.6 oz (112.8 kg)  03/04/20 227 lb (103 kg)  10/02/19 240 lb 6.4  oz (109 kg)    PHYSICAL EXAM:  Constitutional: The patient looks good today, but heavier. She is bright and alert. Her affect and insight are good. She has gained 21 pounds since her last visit. Face: Her face appears normal.  Eyes: There is no obvious arcus or proptosis. Moisture appears normal. Mouth: The oropharynx and tongue appear normal. Oral moisture is normal. Neck: The neck appears to be visibly normal. No carotid bruits are noted. The thyroid gland is absent. The previous induration of her left strap muscle has resolved. She has no palpable cervical nodes or masses in the thyroid bed. Her supraclavicular areas are clear. She does not have any significant lymphadenopathy.  Lungs: Clear, moves air well Heart: Regular rate and rhythm, Normal S1 and S2 Abdomen: Enlarged, but smaller; soft, non-tender Hands: No tremor, normal palms  Legs: No edema Neuro: 5+ strength in the UEs and LEs. Sensation to touch is intact in both legs.  Scalp: She does not have any areas of thinning.  LAB DATA:  Labs 06/08/20: HbA1c 4.8%, CBG 89  Labs 03/04/20: HbA1c 5.2%, CBG 83; TSH 0.10, free T4 2.7, free T3 1.6  Labs 12/11/19: TSH 0.01, free T4 1.9, free  T3 2.7, thyroglobulin <0.1, thyroglobulin antibody <1  Labs 10/02/19: HbA1c 5.1%, CBG 91  Labs 09/30/19: TSH <0.01, free T4 2.4 (ref 0.8-1.8),  free T3 3.8  Labs 09/09/19: TSH  0.008, free T4 3.0  Labs 10/02/18: HbA1c 5.0%, CBG 94; TSH 0.02, free T4 1.7, free T3 2.7, thyroglobulin <0.1 (ref < 0.1), thyroglobulin antibody <1 (ref < or = 1)  Labs 07/03/17: HbA1c 5.0%; TSH 0.01, free T4 1.9, free T3 2.6, thyroglobulin <0.1, thyroglobulin antibody <1  Labs 04/16/17: TSH 0.02, free T4 1.9, free T3 2.8  Labs 01/08/17: HbA1c 5.2%, CBG 90  Labs 07/04/16: HbA1c 4.9%, CBG 70  Labs 06/05/16: TSH 3.54, free T4 1.2, free T3 2.2, thyroglobulin <0.1, thyroglobulin antibody <1  Labs 12/09/15: HbA1c 4.9%; TSH 0.01, free T4 2.4, free T3 3.5  Labs 05/29/15: HbA1c  5.3%; TSH 0.01, free T4 3.4, free T3 5.4, thyroglobulin <0.1, thyroglobulin antibody <1, TPO antibody 7  Labs 12/15/14: HbA1c 4.9%; other labs pending  Labs 06/08/14; HbA1c 5.4%; TSH < 0.008, free T4 2.83, free T3 4.4, thyroglobulin <0.1, anti-thyroglobulin antibody <1  Labs 12/10/13: HbA1c 5.0%  Lab 06/03/13: HbA1c 5.4%; TSH 0.017, free T4 2.81, free T3 4.3, TPO antibody 11.2, thyroglobulin < 0.2, Tg antibody < 20  Lab 01/24/12: TSH 45.646, free T4 0.78, free T3 1.9, thyroglobulin < 0.2, Tg antibody < 20  Lab 11/10/10: TSH 4.896, free T4 1.50, free T3 2.4  Lab 06/26/10: Thyroglobulin < 0.2, thyroglobulin antibody < 20, TSH 0.199, free T4 1.71, free T3 2.7, HbA1c 4.7%   Assessment and Plan:   ASSESSMENT:  1-3. Thyroid cancer/post-surgical hypothyroidism/iatrogenic hyperthyroidism:   A. She has no clinical evidence for recurrence of thyroid cancer. Her thyroglobulin levels through September 2021 have been below the lower limit of detection of the assay ("unmeasurable") since we began measuring them at her first visit with me in 2006.  It appears that her thyroid cancer has been cured.  Her risk of having a recurrence of thyroid cancer is very, very low.   B. Since her thyroglobulin was very good in September 2017, but her TSH was still very suppressed, I reduced her Synthroid dose by about 7%. Since then we have adjusted her Synthroid dose several more times, to include reducing the dose in September 2021.   C. Since last visit she has been healthy and feels well, but has not been exercising and has regained some of the weight she had lost. She is clinically euthyroid.   4. Obesity:   A. She had lost 35 pounds in 17 months, but has regained 21 pounds. .   B. She needs to resume walking.    5. Hypertension: Her BP is a bit higher. Walking more and losing more fat weight will help to control the BP over time.   6. Pre-diabetes: Her HbA1c had decreased to the mid-range when she was actively losing  weight, increased later, but has decreased again, in part due to her previous weight loss. Her current HbA1c is mid-normal for a healthy 55 year-old woman.  7. Hair loss: She does not have any scalp thinning per se.  I recommended that she take either Centrum for Women or One-a-Day for Women. 8. Irregular menses: She seems to be in menopause   PLAN:  1. Diagnostic: Repeat TFTs today.   Repeat thyroglobulin, thyroglobulin antibody, and TFTs prior to next visit. 2. Therapeutic: Continue to take 112 mcg of Synthroid/day. Adjust the Synthroid dose as needed to maintain the TSH level in the  1.0-2.0 range.  3. Patient education: We discussed the issues of thyroid cancer, hyperthyroidism, pre-diabetes, hypertension, obesity, and hair loss at length.  4. Follow up 3 months  Level of Service: This visit lasted in excess of 45 minutes. More than 50% of the visit was devoted to counseling.  Tillman Sers, MD, CDE Adult and Pediatric Endocrinology 06/08/2020 3:10 PM

## 2020-06-08 NOTE — Patient Instructions (Signed)
Follow up visit in 6 months. Please repeat lab tests 1-2 weeks prior.  

## 2020-06-09 LAB — T4, FREE: Free T4: 1.3 ng/dL (ref 0.8–1.8)

## 2020-06-09 LAB — T3, FREE: T3, Free: 2.2 pg/mL — ABNORMAL LOW (ref 2.3–4.2)

## 2020-06-09 LAB — TSH: TSH: 1.56 mIU/L

## 2020-06-28 ENCOUNTER — Encounter (INDEPENDENT_AMBULATORY_CARE_PROVIDER_SITE_OTHER): Payer: Self-pay | Admitting: *Deleted

## 2020-08-27 ENCOUNTER — Other Ambulatory Visit (INDEPENDENT_AMBULATORY_CARE_PROVIDER_SITE_OTHER): Payer: Self-pay | Admitting: "Endocrinology

## 2020-10-29 ENCOUNTER — Emergency Department (HOSPITAL_COMMUNITY): Payer: BC Managed Care – PPO

## 2020-10-29 ENCOUNTER — Other Ambulatory Visit: Payer: Self-pay | Admitting: Family Medicine

## 2020-10-29 ENCOUNTER — Other Ambulatory Visit: Payer: Self-pay

## 2020-10-29 ENCOUNTER — Emergency Department (HOSPITAL_COMMUNITY)
Admission: EM | Admit: 2020-10-29 | Discharge: 2020-10-29 | Disposition: A | Discharge status: Home / Self Care | Payer: BC Managed Care – PPO | Attending: Emergency Medicine | Admitting: Emergency Medicine

## 2020-10-29 ENCOUNTER — Encounter (HOSPITAL_COMMUNITY): Payer: Self-pay | Admitting: *Deleted

## 2020-10-29 DIAGNOSIS — E039 Hypothyroidism, unspecified: Secondary | ICD-10-CM | POA: Diagnosis not present

## 2020-10-29 DIAGNOSIS — R0789 Other chest pain: Secondary | ICD-10-CM | POA: Diagnosis not present

## 2020-10-29 DIAGNOSIS — R109 Unspecified abdominal pain: Secondary | ICD-10-CM | POA: Insufficient documentation

## 2020-10-29 DIAGNOSIS — R202 Paresthesia of skin: Secondary | ICD-10-CM | POA: Diagnosis not present

## 2020-10-29 DIAGNOSIS — Z85850 Personal history of malignant neoplasm of thyroid: Secondary | ICD-10-CM | POA: Insufficient documentation

## 2020-10-29 DIAGNOSIS — I10 Essential (primary) hypertension: Secondary | ICD-10-CM | POA: Diagnosis not present

## 2020-10-29 DIAGNOSIS — Z1231 Encounter for screening mammogram for malignant neoplasm of breast: Secondary | ICD-10-CM

## 2020-10-29 DIAGNOSIS — R110 Nausea: Secondary | ICD-10-CM | POA: Diagnosis not present

## 2020-10-29 DIAGNOSIS — Z79899 Other long term (current) drug therapy: Secondary | ICD-10-CM | POA: Diagnosis not present

## 2020-10-29 LAB — CBC WITH DIFFERENTIAL/PLATELET
Abs Immature Granulocytes: 0.01 10*3/uL (ref 0.00–0.07)
Basophils Absolute: 0 10*3/uL (ref 0.0–0.1)
Basophils Relative: 0 %
Eosinophils Absolute: 0.1 10*3/uL (ref 0.0–0.5)
Eosinophils Relative: 2 %
HCT: 43.4 % (ref 36.0–46.0)
Hemoglobin: 14.3 g/dL (ref 12.0–15.0)
Immature Granulocytes: 0 %
Lymphocytes Relative: 14 %
Lymphs Abs: 0.9 10*3/uL (ref 0.7–4.0)
MCH: 30.4 pg (ref 26.0–34.0)
MCHC: 32.9 g/dL (ref 30.0–36.0)
MCV: 92.1 fL (ref 80.0–100.0)
Monocytes Absolute: 0.3 10*3/uL (ref 0.1–1.0)
Monocytes Relative: 5 %
Neutro Abs: 5.2 10*3/uL (ref 1.7–7.7)
Neutrophils Relative %: 79 %
Platelets: 191 10*3/uL (ref 150–400)
RBC: 4.71 MIL/uL (ref 3.87–5.11)
RDW: 12.1 % (ref 11.5–15.5)
WBC: 6.6 10*3/uL (ref 4.0–10.5)
nRBC: 0 % (ref 0.0–0.2)

## 2020-10-29 LAB — COMPREHENSIVE METABOLIC PANEL
ALT: 22 U/L (ref 0–44)
AST: 24 U/L (ref 15–41)
Albumin: 3.9 g/dL (ref 3.5–5.0)
Alkaline Phosphatase: 89 U/L (ref 38–126)
Anion gap: 10 (ref 5–15)
BUN: 22 mg/dL — ABNORMAL HIGH (ref 6–20)
CO2: 22 mmol/L (ref 22–32)
Calcium: 8.7 mg/dL — ABNORMAL LOW (ref 8.9–10.3)
Chloride: 106 mmol/L (ref 98–111)
Creatinine, Ser: 0.85 mg/dL (ref 0.44–1.00)
GFR, Estimated: >60 mL/min (ref >60)
Glucose, Bld: 134 mg/dL — ABNORMAL HIGH (ref 70–99)
Potassium: 3.7 mmol/L (ref 3.5–5.1)
Sodium: 138 mmol/L (ref 135–145)
Total Bilirubin: 0.7 mg/dL (ref 0.3–1.2)
Total Protein: 7.1 g/dL (ref 6.5–8.1)

## 2020-10-29 LAB — TROPONIN I (HIGH SENSITIVITY)
Troponin I (High Sensitivity): 3 ng/L (ref <18)
Troponin I (High Sensitivity): 3 ng/L (ref <18)

## 2020-10-29 NOTE — ED Notes (Signed)
I triaged this pt earlier today no change in her condition  See my triage note

## 2020-10-29 NOTE — ED Triage Notes (Signed)
Chest pain lt sided numbness  and tingling in her entire lt side since yesterday    no injury  no history  lmp none

## 2020-10-29 NOTE — ED Provider Notes (Signed)
[facility] EMERGENCY DEPARTMENT Provider Note   CSN: [id number] Arrival date & time: 10/29/20  1442     History Chief Complaint  Patient presents with   Numbness    Becky Martinez is a 55 y.o. female.  HPI Patient is a 55 year old with a past medical history of thyroid cancer, hypertension who is presenting for chest pain and left-sided arm and leg change in sensation.  Patient states that yesterday she was at home when she felt chest pressure.  She states that she has has a tightness sensation in her left arm and left leg.  She denies any weakness or numbness.  Patient is able to ambulate without complications.  She denies any neck pain.  She describes the chest pressure as a heaviness.  She also endorses nausea and abdominal pain.  She is tolerating p.o. intake.  She denies any shortness of breath, diaphoresis, palpitations.  Patient describes her left arm and left leg as a tingling sensation.  She denies any headache, facial asymmetry, dysarthria, slurred speech.  She denies any falls or trauma. Patient under a lot of stressors and was concerned she was having a heart attack. Her husband had a heart attack last week and she was concerned if she was having a heart attack.     History reviewed. No pertinent past medical history.  Patient Active Problem List   Diagnosis Date Noted   Thyroid cancer ([place]) 01/28/2012   Post-surgical hypothyroidism 01/28/2012   Iatrogenic hyperthyroidism 01/28/2012   Obesity 01/28/2012   Pre-diabetes 01/28/2012   Hypertension 01/28/2012    History reviewed. No pertinent surgical history.   OB History   No obstetric history on file.     No family history on file.  Social History   Tobacco Use   Smoking status: Never   Smokeless tobacco: Never  Substance Use Topics   Alcohol use: No   Drug use: No    Home Medications Prior to Admission medications   Medication Sig Start Date End Date Taking? Authorizing Provider   ferrous sulfate 325 (65 FE) MG tablet TAKE 1 TABLET (325 MG TOTAL) BY MOUTH DAILY. 03/30/20   Sherrlyn Hock, MD  levothyroxine (SYNTHROID) 150 MCG tablet Take one pill daily. Patient not taking: Reported on 06/08/2020 01/06/20 01/05/21  Sherrlyn Hock, MD  SYNTHROID 112 MCG tablet TAKE 1 TABLET BY MOUTH DAILY BEFORE BREAKFAST. 08/27/20   Sherrlyn Hock, MD    Allergies    Prednisone  Review of Systems   Review of Systems  Constitutional:  Negative for chills, diaphoresis, fatigue and fever.  HENT:  Negative for congestion, dental problem, ear discharge, ear pain, facial swelling, hearing loss, nosebleeds, postnasal drip, rhinorrhea, sinus pain, sneezing, sore throat and trouble swallowing.   Eyes:  Negative for pain and visual disturbance.  Respiratory:  Negative for cough, chest tightness, shortness of breath, wheezing and stridor.   Cardiovascular:  Negative for chest pain, palpitations and leg swelling.       Chest pressure  Gastrointestinal:  Negative for abdominal distention, abdominal pain, blood in stool, constipation, diarrhea, nausea and vomiting.  Endocrine: Negative for polydipsia and polyuria.  Genitourinary:  Negative for difficulty urinating, dysuria, flank pain, frequency, hematuria, urgency, vaginal bleeding and vaginal discharge.  Musculoskeletal:  Negative for myalgias, neck pain and neck stiffness.  Skin:  Negative for rash and wound.  Allergic/Immunologic: Negative for environmental allergies and food allergies.  Neurological:  Negative for dizziness, seizures, syncope, facial asymmetry, speech  difficulty, weakness, light-headedness, numbness and headaches.  Psychiatric/Behavioral:  Negative for agitation, behavioral problems and confusion.    Physical Exam Updated Vital Signs BP (!) 144/87 (BP Location: Right Arm)   Pulse 76   Temp 98.8 F (37.1 C) (Oral)   Resp 18   Ht '5\' 6"'$  (1.676 m)   Wt 114.3 kg   SpO2 100%   BMI 40.67 kg/m   Physical  Exam Vitals and nursing note reviewed.  Constitutional:      General: She is not in acute distress.    Appearance: Normal appearance. She is normal weight. She is not ill-appearing.  HENT:     Head: Normocephalic and atraumatic.     Right Ear: External ear normal.     Left Ear: External ear normal.     Nose: Nose normal. No congestion.     Mouth/Throat:     Mouth: Mucous membranes are moist.     Pharynx: Oropharynx is clear. No oropharyngeal exudate or posterior oropharyngeal erythema.  Eyes:     General: No visual field deficit.    Extraocular Movements: Extraocular movements intact.     Conjunctiva/sclera: Conjunctivae normal.     Pupils: Pupils are equal, round, and reactive to light.  Neck:     Vascular: No carotid bruit.  Cardiovascular:     Rate and Rhythm: Normal rate and regular rhythm.     Pulses: Normal pulses.     Heart sounds: Normal heart sounds. No murmur heard. Pulmonary:     Effort: Pulmonary effort is normal. No respiratory distress.     Breath sounds: Normal breath sounds. No stridor. No wheezing, rhonchi or rales.  Chest:     Chest wall: No tenderness.  Abdominal:     General: Bowel sounds are normal. There is no distension.     Palpations: Abdomen is soft.     Tenderness: There is no abdominal tenderness. There is no right CVA tenderness, left CVA tenderness, guarding or rebound.  Musculoskeletal:        General: No swelling or tenderness. Normal range of motion.     Cervical back: Normal range of motion and neck supple. No rigidity, tenderness or bony tenderness.     Thoracic back: Normal. No tenderness or bony tenderness.     Lumbar back: Normal. No tenderness or bony tenderness.     Right lower leg: No edema.     Left lower leg: No edema.  Skin:    General: Skin is warm and dry.     Coloration: Skin is not jaundiced.  Neurological:     General: No focal deficit present.     Mental Status: She is alert and oriented to person, place, and time. Mental  status is at baseline.     Cranial Nerves: Cranial nerves are intact. No cranial nerve deficit, dysarthria or facial asymmetry.     Sensory: Sensation is intact. No sensory deficit.     Motor: Motor function is intact. No weakness.     Coordination: Coordination is intact. Finger-Nose-Finger Test normal.     Gait: Gait is intact. Gait normal.     Comments: Patient able to ambulate without complications.  Psychiatric:        Mood and Affect: Mood normal.        Behavior: Behavior normal.        Thought Content: Thought content normal.        Judgment: Judgment normal.    ED Results / Procedures / Treatments   Labs (  all labs ordered are listed, but only abnormal results are displayed) Labs Reviewed  COMPREHENSIVE METABOLIC PANEL - Abnormal; Notable for the following components:      Result Value   Glucose, Bld 134 (*)    BUN 22 (*)    Calcium 8.7 (*)    All other components within normal limits  CBC WITH DIFFERENTIAL/PLATELET  TROPONIN I (HIGH SENSITIVITY)  TROPONIN I (HIGH SENSITIVITY)    EKG EKG Interpretation  Date/Time:  Friday October 29 2020 15:03:42 EDT Ventricular Rate:  89 PR Interval:  148 QRS Duration: 98 QT Interval:  394 QTC Calculation: 479 R Axis:   22 Text Interpretation: Normal sinus rhythm Low voltage QRS Incomplete right bundle branch block Borderline ECG Confirmed by Pattricia Boss [phone]) on 10/29/2020 9:17:16 PM  Radiology DG Chest 2 View  Result Date: 10/29/2020 CLINICAL DATA:  Chest pain. EXAM: CHEST - 2 VIEW COMPARISON:  None. FINDINGS: The heart size and mediastinal contours are within normal limits. Both lungs are clear. No visible pleural effusions or pneumothorax. No acute osseous abnormality. IMPRESSION: No evidence of acute cardiopulmonary disease. Electronically Signed   By: Margaretha Sheffield MD   On: 10/29/2020 16:02    Procedures Procedures   Medications Ordered in ED Medications - No data to display  ED Course  I have reviewed the  triage vital signs and the nursing notes.  Pertinent labs & imaging results that were available during my care of the patient were reviewed by me and considered in my medical decision making (see chart for details).    MDM Rules/Calculators/A&P                         Becky Martinez is a 55 y.o. female presenting with chest pressure and paresthesias in her left arm and left leg. Patient is hemodynamically stable and in no acute distress.   Patient is able to ambulate without complications.  She has no focal neurodeficit on my neurological exam.  She has no change in motor or sensation.  Patient describes her left arm heaviness sensation that is most likely secondary to chest pressure. She is able to ambulate without complications. I performed shared decision making with the patient on whether or not she would like a CT scan or MRI of her head. She states that she would like to avoid imaging and will return to the ED if these symptoms continue. Clinically I do not think that she needs any emergent imaging at this time.   Troponins x2 were negative.  Patient denies a cardiac history.  Patient denies any smoking history. Patient was concerned that she was having a heart attack because her husband had chest tightness prior to his MI last week. EKG shows no signs of acute ischemia. CXR showed no acute cardiac or pulmonary abnormality.   She is going to follow up with her PCP.   Patient states compliance and understanding of the plan. I explained labs and imaging to the patient. No further questions at this time from the patient.  The patient is safe and stable for discharge at this time with return precautions provided and a plan for follow-up care in place as needed.  The plan for this patient was discussed with Dr. Jeanell Sparrow, who voiced agreement and who oversaw evaluation and treatment of this patient.   Final Clinical Impression(s) / ED Diagnoses Final diagnoses:  Chest pressure  Paresthesias    Rx  / DC Orders ED Discharge Orders  None        Doretha Sou, MD 10/30/20 [id number]    Pattricia Boss, MD 11/02/20 0000

## 2020-10-29 NOTE — Discharge Instructions (Addendum)
Please follow up with your PCP. Please return to the ED if these paresthesias continue or if you experience continued chest pressure or any episodes of weakness.

## 2020-10-29 NOTE — ED Provider Notes (Signed)
Emergency Medicine Provider Triage Evaluation Note  Becky Martinez , a 55 y.o. female  was evaluated in triage.  Pt complains of chest pain and left-sided numbness.  Patient states that chest pain started yesterday and has been constant since then.  Pain located to the left side of her chest and does not radiate.  Pain described as heaviness.  No alleviating or aggravating factors.  Patient endorses associated nausea.  Denies any associated shortness of breath, diaphoresis, or palpitations.  Patient states that she has had tingling to upper and lower left-sided extremity since yesterday.  Patient states that it feels like her left hand and left foot are swollen.  Patient reports numbness is a tingling sensation.  Patient denies any headache, facial asymmetry, slurred speech, weakness.  No recent falls or injuries.  Review of Systems  Positive: Chest pain, nausea, numbness Negative: Facial asymmetry, slurred speech, weakness, shortness of breath, diaphoresis, vomiting  Physical Exam  BP 119/81 (BP Location: Right Arm)   Pulse 79   Temp 98.6 F (37 C) (Oral)   Resp 16   Ht '5\' 6"'$  (1.676 m)   Wt 114.3 kg   SpO2 100%   BMI 40.67 kg/m  Gen:   Awake, no distress   Resp:  Normal effort  MSK:   Moves extremities without difficulty  Other:  +2 radial pulse bilaterally.  CN II through XII intact.  +5 Strength to bilateral upper and lower extremities.  Pronator drift negative.  Sensation to light touch intact to bilateral upper and lower extremities.  Medical Decision Making  Medically screening exam initiated at 3:19 PM.  Appropriate orders placed.  Aevah Soderman Wicklund was informed that the remainder of the evaluation will be completed by another provider, this initial triage assessment does not replace that evaluation, and the importance of remaining in the ED until their evaluation is complete.  The patient appears stable so that the remainder of the work up may be completed by another provider.       Loni Beckwith, PA-C 10/29/20 1608    Malvin Johns, MD 10/29/20 [phone]

## 2020-11-17 ENCOUNTER — Telehealth (INDEPENDENT_AMBULATORY_CARE_PROVIDER_SITE_OTHER): Payer: Self-pay | Admitting: "Endocrinology

## 2020-11-17 NOTE — Telephone Encounter (Signed)
error 

## 2020-12-02 ENCOUNTER — Other Ambulatory Visit: Payer: Self-pay

## 2020-12-02 ENCOUNTER — Encounter (INDEPENDENT_AMBULATORY_CARE_PROVIDER_SITE_OTHER): Payer: Self-pay | Admitting: "Endocrinology

## 2020-12-02 ENCOUNTER — Ambulatory Visit (INDEPENDENT_AMBULATORY_CARE_PROVIDER_SITE_OTHER): Payer: BC Managed Care – PPO | Admitting: "Endocrinology

## 2020-12-02 VITALS — BP 122/78 | HR 88 | Wt 259.2 lb

## 2020-12-02 DIAGNOSIS — I10 Essential (primary) hypertension: Secondary | ICD-10-CM

## 2020-12-02 DIAGNOSIS — R0789 Other chest pain: Secondary | ICD-10-CM

## 2020-12-02 DIAGNOSIS — E890 Postprocedural hypothyroidism: Secondary | ICD-10-CM | POA: Diagnosis not present

## 2020-12-02 DIAGNOSIS — C73 Malignant neoplasm of thyroid gland: Secondary | ICD-10-CM | POA: Diagnosis not present

## 2020-12-02 DIAGNOSIS — R7303 Prediabetes: Secondary | ICD-10-CM

## 2020-12-02 LAB — POCT GLYCOSYLATED HEMOGLOBIN (HGB A1C): Hemoglobin A1C: 4.8 % (ref 4.0–5.6)

## 2020-12-02 LAB — POCT GLUCOSE (DEVICE FOR HOME USE): POC Glucose: 80 mg/dl (ref 70–99)

## 2020-12-02 MED ORDER — LISINOPRIL 2.5 MG PO TABS: 2.5000 mg | ORAL_TABLET | Freq: Every day | ORAL | 11 refills | Status: DC

## 2020-12-02 NOTE — Progress Notes (Signed)
Subjective:  Patient Name: Becky Martinez Date of Birth: Aug 18, 1965  MRN: [id number]  Becky Martinez  presents to the office today for follow up of her papillary thyroid cancer, post-surgical hypothyroidism, iatrogenic hyperthyroidism, morbid obesity, pre-diabetes, and hypertension.  HISTORY OF PRESENT ILLNESS:   Becky Martinez is a 55 y.o. Caucasian woman.  Becky Martinez was unaccompanied.  1. The patient developed thyroid nodules in 2002. A thyroid ultrasound on 01/03/01 showed a multinodular goiter with a dominant nodule in the right lobe measuring 21 x 21 x 29 mm. On 02/15/01 she underwent a total thyroidectomy. 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 There was no microscopic evidence of extension of the tumor beyond the capsule. In the left lobe a 0.8 cm focus of papillary thyroid cancer was noted. No lymphovascular invasion or extracapsular extension was noted. An I-131 scan performed on 05/11/01 showed residual uptake in the thyroid bed. On 05/27/01 she received a therapeutic I-131 dose of 40.3 mCi. 3. On 09/18/02 she had a follow up I-131 nuclear scan. This study demonstrated residual activity in the right thyroid bed, compatible with residual functioning thyroid tissue and/or tumor. On 10/01/02 she received a 27 mCi dose of I-131. On 06/26/03 she had a follow up I-131 scan which showed some residual activity in the right thyroid bed area. On 07/01/03 she received 100 mCi of I-131. Follow up I-131 scan on 01/18/04 showed no residual thyroid bed activity.  2. I assumed her care in 2006. Follow up I-131 scan on 01/03/05 showed no residual thyroid bed activity. After ensuring that her TSH was then appropriately suppressed by supraphysiologic doses of Synthroid, I ordered a Thyrogen-stimulated I-131 scan on 01/19/06. This study again showed no evidence of residual thyroid bed activity. Since then I have followed her with serial thyroglobulin  measurements and anti-thyroglobulin antibody measurements. Her thyroglobulin values have consistently been below the lower limit of detection of the assay. Her anti-thyroglobulin antibody values have consistently been below 20.  3. The patient's last [facility] visit was on 06/08/20: I reduced her Synthroid dosage further to 112 mcg/day every day.   A. In the interim she had been healthy until 10/29/20 when she went to the ED for the complaint of chest pressure in the left chest accompanied by left arm sensation changes. Her evaluation showed no acute ischemia.   B. Her husband had had an MI the week previous. C. She has regained weight. She has been fairly strict with her diet, but has not been walking.  D. Her menses have ceased. LMP was in May 2021 and she had missed menses for about 4-5 months prior to then.   E. She is also taking ferrous sulfate, one 325 mg tablet each evening.   F. She developed a rash after her second [place] vaccination. She has not yet had the booster.   4. Constitutional. Trena feels "pretty good". She no longer has any hair loss.  Energy level is "good". She usually sleeps well. She pretty much only drinks water, but does drink coffee in the mornings. She no longer gets unusually cold., but she is having hot flashes. Her weight has increased another 11 pounds in the past 6 months, equivalent to deficit of about 200 calories per day.  Eyes: Becky Martinez's vision is slowly changing, c/w presbyopia. She now wears prescription glasses all the time. There are no other significant problems with her eyes.  Neck: Becky Martinez is not aware of any problems  relating to the anterior neck and thyroid bed. She does sometimes have the sensation of food sticking in her throat. There have not been any  significant problems of swelling, pain, soreness, tenderness, pressure, discomfort.  Heart: As above. Rashad feels the expected increase in heart rate during exercise or other physical activities. There have been no  significant problems with palpitations, irregular heart beats, chest pain, or chest pressure. Gastrointestinal: She does not have much belly hunger. Stomach and intestines seem to be working normally. Bowel movements are normal. There are no significant complaints of excessive hunger, acid reflux, upset stomach, stomach aches or pains, diarrhea, or constipation. Hands: No problems, no tremor, no sweating Legs: Normal muscle bulk and strength. No edema. Feet: She has been having burning at rest, pains when walking, and a feeling of being swollen in her left foot for about 203 weeks.  Neuro: No problems with strength, sensation, or balance Psychological:  The past few months have been stressful. She feels "pretty good".  Mental: Becky Martinez is still doing well in terms of her abilities to think, to pay attention, to remember, and to make decisions GYN: As above - She is having hot flashes.  PAST MEDICAL, FAMILY, AND SOCIAL HISTORY:  No past medical history on file.  No family history on file.   Current Outpatient Medications:    ferrous sulfate 325 (65 FE) MG tablet, TAKE 1 TABLET (325 MG TOTAL) BY MOUTH DAILY., Disp: 90 tablet, Rfl: 3   SYNTHROID 112 MCG tablet, TAKE 1 TABLET BY MOUTH DAILY BEFORE BREAKFAST., Disp: 90 tablet, Rfl: 1   levothyroxine (SYNTHROID) 150 MCG tablet, Take one pill daily. (Patient not taking: No sig reported), Disp: 90 tablet, Rfl: 3  Allergies as of 12/02/2020 - Review Complete 10/29/2020  Allergen Reaction Noted   Prednisone Other (See Comments) 12/29/2019    1. 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 She finished her master's degree in education in August 2016.  2. Activities: Daily exercise, helping her mother 3. Smoking, alcohol, or drugs: None 4. Primary Care Provider: [facility]: There are no other significant problems involving Becky Martinez's other body systems.   Objective:   Vital Signs:  BP 122/78 (BP Location: Right Arm, Patient Position: Sitting)   Pulse 88   Wt 259 lb 3.2 oz (117.6 kg)   BMI 41.84 kg/m    Ht Readings from Last 3 Encounters:  10/29/20 '5\' 6"'$  (1.676 m)  07/09/17 5' 4.57" (1.64 m)   Wt Readings from Last 3 Encounters:  12/02/20 259 lb 3.2 oz (117.6 kg)  10/29/20 252 lb (114.3 kg)  06/08/20 248 lb 9.6 oz (112.8 kg)    PHYSICAL EXAM:  Constitutional: The patient looks good today, but heavier. She is bright and alert. Her affect and insight are good. She has gained 11 pounds since her last visit.  Face: Her face appears normal.  Eyes: There is no obvious arcus or proptosis. Moisture appears normal. Mouth: The oropharynx and tongue appear normal. Oral moisture is normal. Neck: The neck appears to be visibly normal. No carotid bruits are noted. The thyroid gland is absent. The previous induration of her left strap muscle has resolved. She has no palpable cervical nodes or masses in the thyroid bed. Her supraclavicular areas are clear. She does not have any significant lymphadenopathy in the anterior or posterior neck..  Lungs: Clear, moves air well Heart: Regular rate and rhythm,  Normal S1 and S2 Abdomen: Enlarged, but smaller; soft, non-tender Hands: No tremor, normal palms  Legs: No edema Neuro: 5+ strength in the UEs and LEs. Sensation to touch is intact in both legs.  Scalp: She does not have any areas of thinning.  LAB DATA:  Labs 12/02/20: HbA1c 4.8%; CBG 80  Labs 10/29/20: CMP normal, except glucose 134 and calcium 8.7 (ref 8.9-10.3) ; CBC normal; troponin I 3 (ref ,18)  Labs 06/08/20: HbA1c 4.8%, CBG 89; TSH 1.56, free T4 1.3, free T3 2.2  Labs 03/04/20: HbA1c 5.2%, CBG 83; TSH 0.10, free T4 2.7, free T3 1.6  Labs 12/11/19: TSH 0.01, free T4 1.9, free T3 2.7, thyroglobulin <0.1, thyroglobulin antibody <1  Labs 10/02/19: HbA1c 5.1%, CBG 91  Labs 09/30/19: TSH <0.01, free T4 2.4 (ref 0.8-1.8),  free T3 3.8  Labs 09/09/19: TSH   0.008, free T4 3.0  Labs 10/02/18: HbA1c 5.0%, CBG 94; TSH 0.02, free T4 1.7, free T3 2.7, thyroglobulin <0.1 (ref < 0.1), thyroglobulin antibody <1 (ref < or = 1)  Labs 07/03/17: HbA1c 5.0%; TSH 0.01, free T4 1.9, free T3 2.6, thyroglobulin <0.1, thyroglobulin antibody <1  Labs 04/16/17: TSH 0.02, free T4 1.9, free T3 2.8  Labs 01/08/17: HbA1c 5.2%, CBG 90  Labs 07/04/16: HbA1c 4.9%, CBG 70  Labs 06/05/16: TSH 3.54, free T4 1.2, free T3 2.2, thyroglobulin <0.1, thyroglobulin antibody <1  Labs 12/09/15: HbA1c 4.9%; TSH 0.01, free T4 2.4, free T3 3.5  Labs 05/29/15: HbA1c 5.3%; TSH 0.01, free T4 3.4, free T3 5.4, thyroglobulin <0.1, thyroglobulin antibody <1, TPO antibody 7  Labs 12/15/14: HbA1c 4.9%; other labs pending  Labs 06/08/14; HbA1c 5.4%; TSH < 0.008, free T4 2.83, free T3 4.4, thyroglobulin <0.1, anti-thyroglobulin antibody <1  Labs 12/10/13: HbA1c 5.0%  Lab 06/03/13: HbA1c 5.4%; TSH 0.017, free T4 2.81, free T3 4.3, TPO antibody 11.2, thyroglobulin < 0.2, Tg antibody < 20  Lab 01/24/12: TSH 45.646, free T4 0.78, free T3 1.9, thyroglobulin < 0.2, Tg antibody < 20  Lab 11/10/10: TSH 4.896, free T4 1.50, free T3 2.4  Lab 06/26/10: Thyroglobulin < 0.2, thyroglobulin antibody < 20, TSH 0.199, free T4 1.71, free T3 2.7, HbA1c 4.7%   Assessment and Plan:   ASSESSMENT:  1-3. Thyroid cancer/post-surgical hypothyroidism/iatrogenic hyperthyroidism:   A. She has no clinical evidence for recurrence of thyroid cancer. Her thyroglobulin levels through September 2021 have been below the lower limit of detection of the assay ("unmeasurable") since we began measuring them at her first visit with me in 2006.  It appears that her thyroid cancer has been cured.  Her risk of having a recurrence of thyroid cancer is very, very low.   B. Since her thyroglobulin was very good in September 2017, but her TSH was still very suppressed, I reduced her Synthroid dose by about 7%. Since then we have adjusted her  Synthroid dose several more times, to include reducing the dose in September 2021.   C. Since last visit she has been healthy and feels well, except for her episode of chest pressure. She has been walking with her husband recently as part of his post-MI care. She is clinically euthyroid.   4. Obesity:   A. She had lost 35 pounds in 17 months, but has regained 34 pounds. .   B. She needs to continue walking and eat differently.    5. Hypertension: Her BP is a bit higher. Walking more and losing more fat weight will help to control the  BP over time. Given her recent chest pressure, it is prudent to strart here on low-dose lisinopril   6. Pre-diabetes: Her HbA1c had decreased to the mid-range when she was actively losing weight, increased later, but has decreased again, in part due to her previous weight loss. Her current HbA1c is mid-normal for a healthy 55 year-old woman.  7. Hair loss: She does not have any scalp thinning per se.  I recommended that she take either Centrum for Women or One-a-Day for Women. 8. Irregular menses and hot flashes: She seems to be in menopause  9. Hypocalcemia: Her calcium level was low at the ED. She does not drink much milk. She eats cheese, but not much yogurt. He is at risk for vitamin D deficiency, hyperparathyroidism, and osteopenia/porosis.   PLAN:  1. Diagnostic: Repeat TFTs , PTH, calcium, and 25-OH vitamin D today.   Repeat thyroglobulin, thyroglobulin antibody, and TFTs prior to next visit.  2. Therapeutic: Continue to take 112 mcg of Synthroid/day. Adjust the Synthroid dose as needed to maintain the TSH level in the 1.0-2.0 range.  3. Patient education: We discussed the issues of thyroid cancer, hyperthyroidism, pre-diabetes, hypertension, obesity, and hair loss at length.  4. Follow up 3 months  Level of Service: This visit lasted in excess of 45 minutes. More than 50% of the visit was devoted to counseling.  Tillman Sers, MD, CDE Adult and Pediatric  Endocrinology 12/02/2020 3:22 PM

## 2020-12-02 NOTE — Patient Instructions (Signed)
Follow up visit in 3 months. Please repeat lab tests 1-2 weeks prior.  

## 2020-12-07 LAB — T4, FREE: Free T4: 1.5 ng/dL (ref 0.8–1.8)

## 2020-12-07 LAB — VITAMIN D 1,25 DIHYDROXY
Vitamin D 1, 25 (OH)2 Total: 49 pg/mL (ref 18–72)
Vitamin D2 1, 25 (OH)2: <8 pg/mL
Vitamin D3 1, 25 (OH)2: 49 pg/mL

## 2020-12-07 LAB — TSH: TSH: 1.86 mIU/L

## 2020-12-07 LAB — VITAMIN D 25 HYDROXY (VIT D DEFICIENCY, FRACTURES): Vit D, 25-Hydroxy: 24 ng/mL — ABNORMAL LOW (ref 30–100)

## 2020-12-07 LAB — PTH, INTACT AND CALCIUM
Calcium: 8.8 mg/dL (ref 8.6–10.4)
PTH: 27 pg/mL (ref 16–77)

## 2020-12-07 LAB — T3, FREE: T3, Free: 2.5 pg/mL (ref 2.3–4.2)

## 2020-12-09 ENCOUNTER — Ambulatory Visit (INDEPENDENT_AMBULATORY_CARE_PROVIDER_SITE_OTHER): Payer: BC Managed Care – PPO | Admitting: "Endocrinology

## 2020-12-20 ENCOUNTER — Other Ambulatory Visit: Payer: Self-pay

## 2020-12-20 ENCOUNTER — Ambulatory Visit
Admission: RE | Admit: 2020-12-20 | Discharge: 2020-12-20 | Disposition: A | Discharge status: Home / Self Care | Payer: BC Managed Care – PPO | Source: Ambulatory Visit | Attending: Family Medicine | Admitting: Family Medicine

## 2020-12-20 DIAGNOSIS — Z1231 Encounter for screening mammogram for malignant neoplasm of breast: Secondary | ICD-10-CM

## 2021-01-17 ENCOUNTER — Ambulatory Visit (INDEPENDENT_AMBULATORY_CARE_PROVIDER_SITE_OTHER): Payer: BC Managed Care – PPO

## 2021-01-17 ENCOUNTER — Ambulatory Visit: Payer: BC Managed Care – PPO | Admitting: Podiatry

## 2021-01-17 ENCOUNTER — Other Ambulatory Visit: Payer: Self-pay

## 2021-01-17 DIAGNOSIS — M778 Other enthesopathies, not elsewhere classified: Secondary | ICD-10-CM

## 2021-01-17 DIAGNOSIS — M79672 Pain in left foot: Secondary | ICD-10-CM | POA: Diagnosis not present

## 2021-01-17 DIAGNOSIS — G5762 Lesion of plantar nerve, left lower limb: Secondary | ICD-10-CM | POA: Diagnosis not present

## 2021-01-17 MED ORDER — MELOXICAM 15 MG PO TABS: 15.0000 mg | ORAL_TABLET | Freq: Every day | ORAL | 0 refills | Status: DC | PRN

## 2021-01-17 NOTE — Patient Instructions (Signed)
Morton Neuralgia Morton neuralgia is foot pain that affects the ball of the foot and the area near the toes. Morton neuralgia occurs when part of a nerve in the foot (digital nerve) is under too much pressure (compressed). When this happens over a long period of time, the nerve can thicken (neuroma) and cause pain. Pain usually occurs between the third and fourth toes.  Morton neuralgia can come and go but may get worse over time. What are the causes? This condition is caused by doing the same things over and over with your foot, such as: Activities such as running or jumping. Wearing shoes that are too tight. What increases the risk? You may be at higher risk for Morton neuralgia if you: Are female. Wear high heels. Wear shoes that are narrow or tight. Do activities that repeatedly stretch your toes, such as: Running. Ballet. Long-distance walking. What are the signs or symptoms? The first symptom of Morton neuralgia is pain that spreads from the ball of the foot to the toes. It may feel like you are walking on a marble. Pain usually gets worse with walking and goes away at night. Other symptoms may include numbness and cramping of your toes. Both feet are equally affected, but rarely at the same time. How is this diagnosed? This condition is diagnosed based on your symptoms, your medical history, and a physical exam. Your health care provider may: Squeeze your foot just behind your toe. Ask you to move your toes to check for pain. Ask about your physical activity level. You also may have imaging tests, such as an X-ray, ultrasound, or MRI. How is this treated? Treatment depends on how severe your condition is and what causes it. Treatment may involve: Wearing different shoes that are not too tight, are low-heeled, and provide good support. For some people, this is the only treatment needed. Wearing an over-the-counter or custom supportive pad (orthotic) under the front of your  foot. Getting injections of numbing medicine and anti-inflammatory medicine (steroid) in the nerve. Having surgery to remove part of the thickened nerve. Follow these instructions at home: Managing pain, stiffness, and swelling  Massage your foot as needed. Wear orthotics as told by your health care provider. If directed, put ice on your foot: Put ice in a plastic bag. Place a towel between your skin and the bag. Leave the ice on for 20 minutes, 2-3 times a day. Avoid activities that cause pain or make pain worse. If you play sports, ask your health care provider when it is safe for you to return to sports. Raise (elevate) your foot above the level of your heart while lying down and, when possible, while sitting. General instructions Take over-the-counter and prescription medicines only as told by your health care provider. Do not drive or use heavy machinery while taking prescription pain medicine. Wear shoes that: Have soft soles. Have a wide toe area. Provide arch support. Do not pinch or squeeze your feet. Have room for your orthotics, if applicable. Keep all follow-up visits as told by your health care provider. This is important. Contact a health care provider if: Your symptoms get worse or do not get better with treatment and home care. Summary Morton neuralgia is foot pain that affects the ball of the foot and the area near the toes. Pain usually occurs between the third and fourth toes, gets worse with walking, and goes away at night. Morton neuralgia occurs when part of a nerve in the foot (digital nerve) is  under too much pressure. When this happens over a long period of time, the nerve can thicken (neuroma) and cause pain. This condition is caused by doing the same things over and over with your foot, such as running or jumping, wearing shoes that are too tight, or wearing high heels. Treatment may involve wearing low-heeled shoes that are not too tight, wearing a supportive  pad (orthotic) under the front of your foot, getting injections in the nerve, or having surgery to remove part of the thickened nerve. This information is not intended to replace advice given to you by your health care provider. Make sure you discuss any questions you have with your health care provider. Document Revised: 06/22/2020 Document Reviewed: 03/27/2017 Elsevier Patient Education  2022 [employer].

## 2021-01-24 NOTE — Progress Notes (Signed)
Subjective:   Patient ID: Orlin Hilding, female   DOB: 55 y.o.   MRN: [id number]   HPI 55 year old female presents the office today for concerns of left foot pain which is been ongoing about 2 months.  She states that she gets pain in the ball of her foot.  She does get some numbness to her second third toes.  She denies any recent injury or trauma.  She has had no recent treatment.  Occasional swelling.   Review of Systems  All other systems reviewed and are negative.  No past medical history on file.  No past surgical history on file.   Current Outpatient Medications:    meloxicam (MOBIC) 15 MG tablet, Take 1 tablet (15 mg total) by mouth daily as needed for pain., Disp: 30 tablet, Rfl: 0   ferrous sulfate 325 (65 FE) MG tablet, TAKE 1 TABLET (325 MG TOTAL) BY MOUTH DAILY., Disp: 90 tablet, Rfl: 3   levothyroxine (SYNTHROID) 150 MCG tablet, Take one pill daily. (Patient not taking: No sig reported), Disp: 90 tablet, Rfl: 3   lisinopril (ZESTRIL) 2.5 MG tablet, Take 1 tablet (2.5 mg total) by mouth daily., Disp: 30 tablet, Rfl: 11   SYNTHROID 112 MCG tablet, TAKE 1 TABLET BY MOUTH DAILY BEFORE BREAKFAST., Disp: 90 tablet, Rfl: 1  Allergies  Allergen Reactions   Prednisone Other (See Comments)          Objective:  Physical Exam  General: AAO x3, NAD  Dermatological: Skin is warm, dry and supple bilateral. There are no open sores, no preulcerative lesions, no rash or signs of infection present.  Vascular: Dorsalis Pedis artery and Posterior Tibial artery pedal pulses are 2/4 bilateral with immedate capillary fill time. There is no pain with calf compression, swelling, warmth, erythema.   Neruologic: Grossly intact via light touch bilateral.  Negative Tinel sign.  Musculoskeletal: There is tenderness palpation on the left second interspace and is palpable neuroma noted.  She is getting numbness into the second and third toes as well.  There is no area of pinpoint tenderness.   Minimal edema.  There is no erythema.  No other area discomfort.  Muscular strength 5/5 in all groups tested bilateral.  Gait: Unassisted, Nonantalgic.       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  There is no evidence of acute fracture or stress fracture identified today. -We discussed steroid injection but held off on this.  Prescribe meloxicam.  Discussed shoes and good arch support.  Neuroma pad.  If symptoms continue consider steroid injection.  Trula Slade DPM

## 2021-02-13 ENCOUNTER — Other Ambulatory Visit: Payer: Self-pay | Admitting: Podiatry

## 2021-02-18 ENCOUNTER — Other Ambulatory Visit (INDEPENDENT_AMBULATORY_CARE_PROVIDER_SITE_OTHER): Payer: Self-pay | Admitting: "Endocrinology

## 2021-02-21 ENCOUNTER — Ambulatory Visit: Payer: BC Managed Care – PPO | Admitting: Cardiovascular Disease

## 2021-02-21 ENCOUNTER — Encounter: Payer: Self-pay | Admitting: Cardiovascular Disease

## 2021-02-21 ENCOUNTER — Other Ambulatory Visit: Payer: Self-pay

## 2021-02-21 DIAGNOSIS — Z85850 Personal history of malignant neoplasm of thyroid: Secondary | ICD-10-CM

## 2021-02-21 DIAGNOSIS — E890 Postprocedural hypothyroidism: Secondary | ICD-10-CM | POA: Diagnosis not present

## 2021-02-21 DIAGNOSIS — E785 Hyperlipidemia, unspecified: Secondary | ICD-10-CM

## 2021-02-21 DIAGNOSIS — R072 Precordial pain: Secondary | ICD-10-CM

## 2021-02-21 NOTE — Patient Instructions (Signed)
  Testing/Procedures:  CORONARY CT CALCIUM SCORING AT THE [facility]   Follow-Up: At [facility], you and your health needs are our priority.  As part of our continuing mission to provide you with exceptional heart care, we have created designated Provider Care Teams.  These Care Teams include your primary Cardiologist (physician) and Advanced Practice Providers (APPs -  Physician Assistants and Nurse Practitioners) who all work together to provide you with the care you need, when you need it.  We recommend signing up for the patient portal called "MyChart".  Sign up information is provided on this After Visit Summary.  MyChart is used to connect with patients for Virtual Visits (Telemedicine).  Patients are able to view lab/test results, encounter notes, upcoming appointments, etc.  Non-urgent messages can be sent to your provider as well.   To learn more about what you can do with MyChart, go to [email or web address].    Your next appointment:   As needed

## 2021-02-21 NOTE — Progress Notes (Signed)
Cardiology Office Note    Date:  02/28/2021   ID:  Becky Martinez, DOB February 07, 1966, MRN [id number]  PCP:  Becky Contras, MD  Cardiologist:  Becky Majestic, MD   New cardiology evaluation referred by Becky Drivers, PA at [facility] physicians for evaluation of atypical chest pain.  History of Present Illness:  Becky Martinez is a 55 y.o. female is the wife of a Becky Martinez loss who was treated by me with myocardial infarction.  She is followed at [facility] and has a history of morbid obesity, mild hyperlipidemia, had recently been evaluated with atypical chest pain.  She was evaluated by Becky Drivers, PA who did not feel her symptoms were ischemic in etiology but referred the patient to me for further evaluation.  Becky Martinez admits to increased stress and when this occurs she begins to notice some left chest discomfort which goes to her left arm.  Her son recently had ACL reconstructive surgery and has been immobilized for 7 weeks.  She had been helping him out of the house using her arms.  She is a history of iron deficiency and is on ferrous sulfate.  She also has a history of hypothyroidism on levothyroxine 112 mcg.  She was recently told to start lisinopril 2.5 mg but she has not taken this.  He stated that her blood pressure was only elevated on 1 occasion but most of the time has been low.  Lipid studies have shown an LDL cholesterol at 109.  She specifically denies any exertionally precipitated chest pain or significant exertional dyspnea.  She admits that she exercises 5 days/week for at least 30 minutes.  He presents for her initial evaluation.  Past medical history is notable for thyroid cancer in 2007 and she is status post thyroidectomy.  Surgical history is notable for thyroidectomy.  Current Medications: Outpatient Medications Prior to Visit  Medication Sig Dispense Refill   ferrous sulfate 325 (65 FE) MG tablet TAKE 1 TABLET (325 MG TOTAL) BY MOUTH DAILY. 90 tablet 3   SYNTHROID 112 MCG tablet TAKE 1 TABLET  BY MOUTH EVERY DAY BEFORE BREAKFAST 90 tablet 1   levothyroxine (SYNTHROID) 150 MCG tablet Take one pill daily. 90 tablet 3   lisinopril (ZESTRIL) 2.5 MG tablet Take 1 tablet (2.5 mg total) by mouth daily. (Patient not taking: Reported on 02/21/2021) 30 tablet 11   meloxicam (MOBIC) 15 MG tablet TAKE 1 TABLET BY MOUTH EVERY DAY AS NEEDED FOR PAIN (Patient not taking: Reported on 02/21/2021) 30 tablet 0   No facility-administered medications prior to visit.     Allergies:   Prednisone   Social History   Socioeconomic History   Marital status: Married    Spouse name: Not on file   Number of children: Not on file   Years of education: Not on file   Highest education level: Not on file  Occupational History   Not on file  Tobacco Use   Smoking status: Never   Smokeless tobacco: Never  Substance and Sexual Activity   Alcohol use: No   Drug use: No   Sexual activity: Not on file  Other Topics Concern   Not on file  Social History Narrative   Live with husband. No pets.   Social Determinants of Health   Financial Resource Strain: Not on file  Food Insecurity: Not on file  Transportation Needs: Not on file  Physical Activity: Not on file  Stress: Not on file  Social Connections: Not on file  Socially she is married for 32 years.  She has 2 children.  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  There is no tobacco history.  She does not drink alcohol.  Family History: Family history is notable for both parents being alive, mother at age 74.  Her father is 58 and has a history of atrial fibrillation.  She has 1 brother age 70.  A paternal grandfather had sudden death around 21.  Maternal grandmother had suffered a heart attack in her late 26s  ROS General: Negative; No fevers, chills, or night sweats; obesity HEENT: Negative; No changes in vision or hearing, sinus congestion, difficulty swallowing Pulmonary: Negative; No cough, wheezing,  shortness of breath, hemoptysis Cardiovascular: Negative; No chest pain, presyncope, syncope, palpitations GI: Negative; No nausea, vomiting, diarrhea, or abdominal pain GU: Negative; No dysuria, hematuria, or difficulty voiding Musculoskeletal: Negative; no myalgias, joint pain, or weakness Hematologic/Oncology: Negative; no easy bruising, bleeding Endocrine: Negative; no heat/cold intolerance; no diabetes Neuro: Negative; no changes in balance, headaches Skin: Negative; No rashes or skin lesions Psychiatric: Negative; No behavioral problems, depression Sleep: Negative; No snoring, daytime sleepiness, hypersomnolence, bruxism, restless legs, hypnogognic hallucinations, no cataplexy Other comprehensive 14 point system review is negative.   PHYSICAL EXAM:   VS:  BP 114/86 (BP Location: Right Arm)   Pulse (!) 57   Ht '5\' 6"'  (1.676 m)   Wt 266 lb 12.8 oz (121 kg)   SpO2 97%   BMI 43.06 kg/m     Repeat blood pressure by me was 108/78  Wt Readings from Last 3 Encounters:  02/21/21 266 lb 12.8 oz (121 kg)  12/02/20 259 lb 3.2 oz (117.6 kg)  10/29/20 252 lb (114.3 kg)    General: Alert, oriented, no distress.  Skin: normal turgor, no rashes, warm and dry HEENT: Normocephalic, atraumatic. Pupils equal round and reactive to light; sclera anicteric; extraocular muscles intact; Fundi ** Nose without nasal septal hypertrophy Mouth/Parynx benign; Mallinpatti scale 3 Neck: No JVD, no carotid bruits; normal carotid upstroke Lungs: clear to ausculatation and percussion; no wheezing or rales Chest wall: without tenderness to palpitation Heart: PMI not displaced, RRR, s1 s2 normal, 1/6 systolic murmur, no diastolic murmur, no rubs, gallops, thrills, or heaves Abdomen: soft, nontender; no hepatosplenomehaly, BS+; abdominal aorta nontender and not dilated by palpation. Back: no CVA tenderness Pulses 2+ Musculoskeletal: full range of motion, normal strength, no joint deformities Extremities: no  clubbing cyanosis or edema, Homan's sign negative  Neurologic: grossly nonfocal; Cranial nerves grossly wnl Psychologic: Normal mood and affect   Studies/Labs Reviewed:   EKG:  EKG is ordered today.   ECG (independently read by me):  Sinus bradycardia at 57; no STT changes; no ectopy  Recent Labs: BMP Latest Ref Rng & Units 12/02/2020 10/29/2020  Glucose 70 - 99 mg/dL - 134(H)  BUN 6 - 20 mg/dL - 22(H)  Creatinine 0.44 - 1.00 mg/dL - 0.85  Sodium 135 - 145 mmol/L - 138  Potassium 3.5 - 5.1 mmol/L - 3.7  Chloride 98 - 111 mmol/L - 106  CO2 22 - 32 mmol/L - 22  Calcium 8.6 - 10.4 mg/dL 8.8 8.7(L)     Hepatic Function Latest Ref Rng & Units 10/29/2020  Total Protein 6.5 - 8.1 g/dL 7.1  Albumin 3.5 - 5.0 g/dL 3.9  AST 15 - 41 U/L 24  ALT 0 - 44 U/L 22  Alk Phosphatase 38 - 126 U/L 89  Total Bilirubin 0.3 -  1.2 mg/dL 0.7    CBC Latest Ref Rng & Units 10/29/2020  WBC 4.0 - 10.5 K/uL 6.6  Hemoglobin 12.0 - 15.0 g/dL 14.3  Hematocrit 36.0 - 46.0 % 43.4  Platelets 150 - 400 K/uL 191   Lab Results  Component Value Date   MCV 92.1 10/29/2020   Lab Results  Component Value Date   TSH 1.86 12/02/2020   Lab Results  Component Value Date   HGBA1C 4.8 12/02/2020     BNP No results found for: BNP  ProBNP No results found for: PROBNP   Lipid Panel  No results found for: CHOL, TRIG, HDL, CHOLHDL, VLDL, LDLCALC, LDLDIRECT, LABVLDL   RADIOLOGY: CT CARDIAC SCORING (SELF PAY ONLY)  Addendum Date: 02/25/2021   ADDENDUM REPORT: 02/25/2021 15:32 CLINICAL DATA:  18F for cardiovascular disease risk stratification EXAM: Coronary Calcium Score TECHNIQUE: A gated, non-contrast computed tomography scan of the heart was performed using 1m slice thickness. Axial images were analyzed on a dedicated workstation. Calcium scoring of the coronary arteries was performed using the Agatston method. FINDINGS: Coronary arteries: Normal origins. Coronary Calcium Score: 0 Pericardium: Normal. Ascending  Aorta: Normal caliber. Non-cardiac: [facility]. IMPRESSION: Coronary calcium score of 0. This was 0 percentile for age-, race-, and sex-matched controls. RECOMMENDATIONS: Coronary artery calcium (CAC) score is a strong predictor of incident coronary heart disease (CHD) and provides predictive information beyond traditional risk factors. CAC scoring is reasonable to use in the decision to withhold, postpone, or initiate statin therapy in intermediate-risk or selected borderline-risk asymptomatic adults (age 54-75years and LDL-C >=70 to <190 mg/dL) who do not have diabetes or established atherosclerotic cardiovascular disease (ASCVD).* In intermediate-risk (10-year ASCVD risk >=7.5% to <20%) adults or selected borderline-risk (10-year ASCVD risk >=5% to <7.5%) adults in whom a CAC score is measured for the purpose of making a treatment decision the following recommendations have been made: If CAC=0, it is reasonable to withhold statin therapy and reassess in 5 to 10 years, as long as higher risk conditions are absent (diabetes mellitus, family history of premature CHD in first degree relatives (males <55 years; females <65 years), cigarette smoking, or LDL >=190 mg/dL). If CAC is 1 to 99, it is reasonable to initiate statin therapy for patients >=520years of age. If CAC is >=100 or >=75th percentile, it is reasonable to initiate statin therapy at any age. Cardiology referral should be considered for patients with CAC scores >=400 or >=75th percentile. *2018 AHA/ACC/AACVPR/AAPA/ABC/ACPM/ADA/AGS/APhA/ASPC/NLA/PCNA Guideline on the Management of Blood Cholesterol: A Report of the American College of Cardiology/American Heart Association Task Force on Clinical Practice Guidelines. J Am Coll Cardiol. 2019;73(24):3168-3209. TSkeet Latch MD Electronically Signed   By: TSkeet LatchM.D.   On: 02/25/2021 15:32   Result Date: 02/25/2021 EXAM: OVER-READ INTERPRETATION  CT CHEST The  following report is an over-read performed by radiologist Dr. DVinnie Langtonof [facility], [place] 02/25/[place]21. This over-read does not include interpretation of cardiac or coronary anatomy or pathology. The coronary calcium score interpretation by the cardiologist is attached. COMPARISON:  None. FINDINGS: Within the visualized portions of the thorax there are no suspicious appearing pulmonary nodules or masses, there is no acute consolidative airspace disease, no pleural effusions, no pneumothorax and no lymphadenopathy. Visualized portions of the upper abdomen are unremarkable. There are no aggressive appearing lytic or blastic lesions noted in the visualized portions of the skeleton. IMPRESSION: 1. No significant incidental noncardiac findings are noted. Electronically Signed: By: DVinnie Langton  M.D. On: 02/25/2021 13:51     Additional studies/ records that were reviewed today include:  I reviewed the records of [facility] physicians and recent evaluation by Becky Drivers, PA   ASSESSMENT:    1. Precordial pain   2. History of thyroid cancer   3. Post-surgical hypothyroidism   4. Morbid obesity ([place])   5. Mild hyperlipidemia     PLAN:  Becky Martinez is a very pleasant 55 year old female who has family history for cardiac disease.  Her husband had suffered a myocardial infarction on October 22, 2020 underwent emergent intervention to total occlusion of his LAD at the ostium with successful DES stenting.  Recently, she admits to being under increased stress.  She has also been helping her son who has been immobile for 7 weeks following ACL reconstructive surgery.  She had been helping him get around.  She began to notice nonexertional left-sided chest discomfort which would radiate to her left arm.  Oftentimes she would experience this during periods of increased stress but she denied any exertional component.  I suspect her chest pain most likely is musculoskeletal in etiology.  She denies any  history of hypertension but she states on 1 blood pressure recording her blood pressure was elevated when she saw her provider and she was given a prescription for lisinopril 2.5 mg.  However she has been taking her blood pressure and has been low and therefore she never instituted therapy.  Blood pressure today is excellent and a repeat by me was 108/78.  Recent laboratory had shown a total cholesterol 201 and LDL of 109 with triglycerides 96.  She states that she exercises at least 3 to 5 days/week at all possible.  I reviewed her records from [place].  Her ECG today is stable with sinus bradycardia at 57 bpm without significant ST-T abnormalities.  I have recommended that she undergo a coronary calcium score for initial assessment of potential coronary calcification.  With her body habitus, I am concerned that a nuclear stress test may be met with attenuation artifact.  I discussed weight loss and continued exercise.  He tells me in January 2022 she weighed 300 pounds and had lost weight down to 230 but has recently gained weight back to 266.  We discussed Mediterranean diet.  I will contact her regarding her calcium score and further recommendations will be made at that time.   Medication Adjustments/Labs and Tests Ordered: Current medicines are reviewed at length with the patient today.  Concerns regarding medicines are outlined above.  Medication changes, Labs and Tests ordered today are listed in the Patient Instructions below. Patient Instructions   Testing/Procedures:  CORONARY CT CALCIUM SCORING AT THE [facility]   Follow-Up: At [facility], you and your health needs are our priority.  As part of our continuing mission to provide you with exceptional heart care, we have created designated Provider Care Teams.  These Care Teams include your primary Cardiologist (physician) and Advanced Practice Providers (APPs -  Physician Assistants and Nurse Practitioners) who all work  together to provide you with the care you need, when you need it.  We recommend signing up for the patient portal called "MyChart".  Sign up information is provided on this After Visit Summary.  MyChart is used to connect with patients for Virtual Visits (Telemedicine).  Patients are able to view lab/test results, encounter notes, upcoming appointments, etc.  Non-urgent messages can be sent to your provider as well.   To learn more  about what you can do with MyChart, go to [email or web address].    Your next appointment:   As needed   Signed, Becky Majestic, MD  02/28/2021 6:17 PM    [facility] [address] [place], [place], [place]  [address] Phone: [phone]

## 2021-02-25 ENCOUNTER — Ambulatory Visit (HOSPITAL_BASED_OUTPATIENT_CLINIC_OR_DEPARTMENT_OTHER)
Admission: RE | Admit: 2021-02-25 | Discharge: 2021-02-25 | Disposition: A | Discharge status: Home / Self Care | Payer: BC Managed Care – PPO | Source: Ambulatory Visit | Attending: Cardiovascular Disease | Admitting: Cardiovascular Disease

## 2021-02-25 ENCOUNTER — Other Ambulatory Visit: Payer: Self-pay

## 2021-02-25 DIAGNOSIS — I10 Essential (primary) hypertension: Secondary | ICD-10-CM | POA: Insufficient documentation

## 2021-02-25 DIAGNOSIS — R072 Precordial pain: Secondary | ICD-10-CM | POA: Insufficient documentation

## 2021-02-28 ENCOUNTER — Ambulatory Visit: Payer: BC Managed Care – PPO | Admitting: Podiatry

## 2021-02-28 ENCOUNTER — Encounter: Payer: Self-pay | Admitting: Cardiovascular Disease

## 2021-03-03 ENCOUNTER — Ambulatory Visit (INDEPENDENT_AMBULATORY_CARE_PROVIDER_SITE_OTHER): Payer: BC Managed Care – PPO | Admitting: "Endocrinology

## 2021-04-18 ENCOUNTER — Encounter: Payer: Self-pay | Admitting: Gastroenterology

## 2021-05-03 ENCOUNTER — Other Ambulatory Visit (INDEPENDENT_AMBULATORY_CARE_PROVIDER_SITE_OTHER): Payer: Self-pay | Admitting: "Endocrinology

## 2021-05-09 ENCOUNTER — Other Ambulatory Visit: Payer: Self-pay

## 2021-05-09 ENCOUNTER — Ambulatory Visit (AMBULATORY_SURGERY_CENTER): Payer: BC Managed Care – PPO | Admitting: *Deleted

## 2021-05-09 VITALS — Ht 65.0 in | Wt 270.0 lb

## 2021-05-09 DIAGNOSIS — Z1211 Encounter for screening for malignant neoplasm of colon: Secondary | ICD-10-CM

## 2021-05-09 MED ORDER — PEG 3350-KCL-NA BICARB-NACL 420 G PO SOLR: 4000.0000 mL | Freq: Once | ORAL | 0 refills | Status: AC

## 2021-05-09 NOTE — Progress Notes (Signed)
No egg or soy allergy known to patient  No issues known to pt with past sedation with any surgeries or procedures Patient denies ever being told they had issues or difficulty with intubation  No FH of Malignant Hyperthermia Pt is not on diet pills Pt is not on  home 02  Pt is not on blood thinners  Pt denies issues with constipation  No A fib or A flutter  Pt is fully vaccinated  for Covid   NO PA's for preps discussed with pt In PV today  Discussed with pt there will be an out-of-pocket cost for prep and that varies from $0 to 70 +  dollars - pt verbalized understanding   Due to the COVID-19 pandemic we are asking patients to follow certain guidelines in PV and the [place]   Pt aware of COVID protocols and [facility] guidelines   PV completed over the phone. Pt verified name, DOB, address and insurance during PV today.  Pt encouraged to call with questions or issues.  If pt has My chart, procedure instructions sent via My Chart  Emailed instructions [email or web address]

## 2021-05-19 ENCOUNTER — Encounter: Payer: Self-pay | Admitting: Gastroenterology

## 2021-05-23 ENCOUNTER — Ambulatory Visit (AMBULATORY_SURGERY_CENTER): Payer: BC Managed Care – PPO | Admitting: Gastroenterology

## 2021-05-23 ENCOUNTER — Encounter: Payer: Self-pay | Admitting: Gastroenterology

## 2021-05-23 VITALS — BP 125/79 | HR 78 | Temp 96.2°F | Resp 18 | Ht 66.0 in | Wt 270.0 lb

## 2021-05-23 DIAGNOSIS — Z1211 Encounter for screening for malignant neoplasm of colon: Secondary | ICD-10-CM

## 2021-05-23 DIAGNOSIS — Z8371 Family history of colonic polyps: Secondary | ICD-10-CM | POA: Diagnosis present

## 2021-05-23 MED ORDER — SODIUM CHLORIDE 0.9 % IV SOLN: 500.0000 mL | Freq: Once | INTRAVENOUS | Status: DC

## 2021-05-23 NOTE — Op Note (Signed)
[facility] Patient Name: Becky Martinez Procedure Date: 05/23/2021 2:25 PM MRN: [id number] Endoscopist: Ladene Artist , MD Age: 56 Referring MD:  Date of Birth: 01-29-1966 Gender: Female Account #: [id number] Procedure:                Colonoscopy Indications:              Colon cancer screening in patient at increased                            risk: Family history of colon polyps in multiple                            1st-degree relatives Medicines:                Monitored Anesthesia Care Procedure:                Pre-Anesthesia Assessment:                           - Prior to the procedure, a History and Physical                            was performed, and patient medications and                            allergies were reviewed. The patient's tolerance of                            previous anesthesia was also reviewed. The risks                            and benefits of the procedure and the sedation                            options and risks were discussed with the patient.                            All questions were answered, and informed consent                            was obtained. Prior Anticoagulants: The patient has                            taken no previous anticoagulant or antiplatelet                            agents. ASA Grade Assessment: III - A patient with                            severe systemic disease. After reviewing the risks                            and benefits, the patient was deemed in  satisfactory condition to undergo the procedure.                           After obtaining informed consent, the colonoscope                            was passed under direct vision. Throughout the                            procedure, the patient's blood pressure, pulse, and                            oxygen saturations were monitored continuously. The                            CF [id number] #[id number] was introduced through  the anus                            and advanced to the the cecum, identified by                            appendiceal orifice and ileocecal valve. The                            ileocecal valve, appendiceal orifice, and rectum                            were photographed. The quality of the bowel                            preparation was good. The colonoscopy was performed                            without difficulty. The patient tolerated the                            procedure well. Scope In: 2:30:15 PM Scope Out: 2:44:44 PM Scope Withdrawal Time: 0 hours 10 minutes 0 seconds  Total Procedure Duration: 0 hours 14 minutes 29 seconds  Findings:                 The perianal and digital rectal examinations were                            normal.                           The entire examined colon appeared normal on direct                            and retroflexion views. Complications:            No immediate complications. Estimated blood loss:                            None. Estimated Blood Loss:  Estimated blood loss: none. Impression:               - The entire examined colon is normal on direct and                            retroflexion views.                           - No specimens collected. Recommendation:           - Repeat colonoscopy in 5 years for screening                            purposes.                           - Patient has a contact number available for                            emergencies. The signs and symptoms of potential                            delayed complications were discussed with the                            patient. Return to normal activities tomorrow.                            Written discharge instructions were provided to the                            patient.                           - Resume previous diet.                           - Continue present medications. Ladene Artist, MD 05/23/2021 2:46:32 PM This report has been  signed electronically.

## 2021-05-23 NOTE — Progress Notes (Signed)
Pt's states no medical or surgical changes since previsit or office visit. VS assessed by C.W 

## 2021-05-23 NOTE — Progress Notes (Signed)
History & Physical  Primary Care Physician:  Antony Contras, MD Primary Gastroenterologist: Lucio Edward, MD  CHIEF COMPLAINT: Family history of colon polyps  HPI: Becky Martinez is a 56 y.o. female with a family history of colon polyps in her mother, father and a second-degree relative with colon cancer for colonoscopy.   Past Medical History:  Diagnosis Date   Allergy    Cancer ([place])    thyroid cancer   Thyroid disease     Past Surgical History:  Procedure Laterality Date   CESAREAN SECTION     x1   TOTAL THYROIDECTOMY     WISDOM TOOTH EXTRACTION      Prior to Admission medications   Medication Sig Start Date End Date Taking? Authorizing Provider  ferrous sulfate 325 (65 FE) MG tablet TAKE 1 TABLET BY MOUTH EVERY DAY 05/03/21  Yes Sherrlyn Hock, MD  Homeopathic Products (ALLERGY MEDICINE PO) Take by mouth. Alternates claritin and zyrtec generic allergy season   Yes [provider]  Multiple Vitamins-Minerals (ONE-A-DAY WOMENS PO) Take by mouth.   Yes [provider]  SYNTHROID 112 MCG tablet TAKE 1 TABLET BY MOUTH EVERY DAY BEFORE BREAKFAST 02/21/21  Yes Sherrlyn Hock, MD    Current Outpatient Medications  Medication Sig Dispense Refill   ferrous sulfate 325 (65 FE) MG tablet TAKE 1 TABLET BY MOUTH EVERY DAY 90 tablet 0   Homeopathic Products (ALLERGY MEDICINE PO) Take by mouth. Alternates claritin and zyrtec generic allergy season     Multiple Vitamins-Minerals (ONE-A-DAY WOMENS PO) Take by mouth.     SYNTHROID 112 MCG tablet TAKE 1 TABLET BY MOUTH EVERY DAY BEFORE BREAKFAST 90 tablet 1   Current Facility-Administered Medications  Medication Dose Route Frequency Provider Last Rate Last Admin   0.9 %  sodium chloride infusion  500 mL Intravenous Once Ladene Artist, MD        Allergies as of 05/23/2021 - Review Complete 05/23/2021  Allergen Reaction Noted   Prednisone Other (See Comments) 12/29/2019    Family History  Problem  Relation Age of Onset   Colon polyps Mother    Colon polyps Father    Colon cancer Maternal Grandmother    Esophageal cancer Neg Hx    Rectal cancer Neg Hx    Stomach cancer Neg Hx     Social History   Socioeconomic History   Marital status: Married    Spouse name: Not on file   Number of children: Not on file   Years of education: Not on file   Highest education level: Not on file  Occupational History   Not on file  Tobacco Use   Smoking status: Never   Smokeless tobacco: Never  Substance and Sexual Activity   Alcohol use: No   Drug use: No   Sexual activity: Not on file  Other Topics Concern   Not on file  Social History Narrative   Live with husband. No pets.   Social Determinants of Health   Financial Resource Strain: Not on file  Food Insecurity: Not on file  Transportation Needs: Not on file  Physical Activity: Not on file  Stress: Not on file  Social Connections: Not on file  Intimate Partner Violence: Not on file    Review of Systems:  All systems reviewed an negative except where noted in HPI.  Gen: Denies any fever, chills, sweats, anorexia, fatigue, weakness, malaise, weight loss, and sleep disorder CV: Denies chest pain, angina, palpitations, syncope, orthopnea,  PND, peripheral edema, and claudication. Resp: Denies dyspnea at rest, dyspnea with exercise, cough, sputum, wheezing, coughing up blood, and pleurisy. GI: Denies vomiting blood, jaundice, and fecal incontinence.   Denies dysphagia or odynophagia. GU : Denies urinary burning, blood in urine, urinary frequency, urinary hesitancy, nocturnal urination, and urinary incontinence. MS: Denies joint pain, limitation of movement, and swelling, stiffness, low back pain, extremity pain. Denies muscle weakness, cramps, atrophy.  Derm: Denies rash, itching, dry skin, hives, moles, warts, or unhealing ulcers.  Psych: Denies depression, anxiety, memory loss, suicidal ideation, hallucinations, paranoia, and  confusion. Heme: Denies bruising, bleeding, and enlarged lymph nodes. Neuro:  Denies any headaches, dizziness, paresthesias. Endo:  Denies any problems with DM, thyroid, adrenal function.   Physical Exam: General:  Alert, well-developed, in NAD Head:  Normocephalic and atraumatic. Eyes:  Sclera clear, no icterus.   Conjunctiva pink. Ears:  Normal auditory acuity. Mouth:  No deformity or lesions.  Neck:  Supple; no masses . Lungs:  Clear throughout to auscultation.   No wheezes, crackles, or rhonchi. No acute distress. Heart:  Regular rate and rhythm; no murmurs. Abdomen:  Soft, nondistended, nontender. No masses, hepatomegaly. No obvious masses.  Normal bowel .    Rectal:  Deferred   Msk:  Symmetrical without gross deformities.. Pulses:  Normal pulses noted. Extremities:  Without edema. Neurologic:  Alert and  oriented x4;  grossly normal neurologically. Skin:  Intact without significant lesions or rashes. Cervical Nodes:  No significant cervical adenopathy. Psych:  Alert and cooperative. Normal mood and affect.   Impression / Plan:   Family history of colon polyps in her mother and father and a second-degree relative with colon cancer for colonoscopy.   [name]. Fuller Plan  05/23/2021, 2:22 PM See Shea Evans, [facility], to contact our on call provider

## 2021-05-23 NOTE — Progress Notes (Signed)
Report given to PACU, vss 

## 2021-05-23 NOTE — Patient Instructions (Signed)
Repeat colonoscopy in 5 years for screening purposes.  YOU HAD AN ENDOSCOPIC PROCEDURE TODAY AT [facility]:   Refer to the procedure report that was given to you for any specific questions about what was found during the examination.  If the procedure report does not answer your questions, please call your gastroenterologist to clarify.  If you requested that your care partner not be given the details of your procedure findings, then the procedure report has been included in a sealed envelope for you to review at your convenience later.  YOU SHOULD EXPECT: Some feelings of bloating in the abdomen. Passage of more gas than usual.  Walking can help get rid of the air that was put into your GI tract during the procedure and reduce the bloating. If you had a lower endoscopy (such as a colonoscopy or flexible sigmoidoscopy) you may notice spotting of blood in your stool or on the toilet paper. If you underwent a bowel prep for your procedure, you may not have a normal bowel movement for a few days.  Please Note:  You might notice some irritation and congestion in your nose or some drainage.  This is from the oxygen used during your procedure.  There is no need for concern and it should clear up in a day or so.  SYMPTOMS TO REPORT IMMEDIATELY:  Following lower endoscopy (colonoscopy or flexible sigmoidoscopy):  Excessive amounts of blood in the stool  Significant tenderness or worsening of abdominal pains  Swelling of the abdomen that is new, acute  Fever of 100F or higher  For urgent or emergent issues, a gastroenterologist can be reached at any hour by calling [phone]. Do not use MyChart messaging for urgent concerns.    DIET:  We do recommend a small meal at first, but then you may proceed to your regular diet.  Drink plenty of fluids but you should avoid alcoholic beverages for 24 hours.  ACTIVITY:  You should plan to take it easy for the rest of today and you should NOT  DRIVE or use heavy machinery until tomorrow (because of the sedation medicines used during the test).    FOLLOW UP: Our staff will call the number listed on your records 48-72 hours following your procedure to check on you and address any questions or concerns that you may have regarding the information given to you following your procedure. If we do not reach you, we will leave a message.  We will attempt to reach you two times.  During this call, we will ask if you have developed any symptoms of COVID 19. If you develop any symptoms (ie: fever, flu-like symptoms, shortness of breath, cough etc.) before then, please call [phone].  If you test positive for Covid 19 in the 2 weeks post procedure, please call and report this information to [place].    If any biopsies were taken you will be contacted by phone or by letter within the next 1-3 weeks.  Please call us at [phone] if you have not heard about the biopsies in 3 weeks.    SIGNATURES/CONFIDENTIALITY: You and/or your care partner have signed paperwork which will be entered into your electronic medical record.  These signatures attest to the fact that that the information above on your After Visit Summary has been reviewed and is understood.  Full responsibility of the confidentiality of this discharge information lies with you and/or your care-partner.

## 2021-05-25 ENCOUNTER — Telehealth: Payer: Self-pay

## 2021-05-25 NOTE — Telephone Encounter (Signed)
?  Follow up Call- ? ?Call back number 05/23/2021  ?Post procedure Call Back phone  # [phone]  ?Permission to leave phone message Yes  ?Some recent data might be hidden  ?  ? ?Patient questions: ? ?Do you have a fever, pain , or abdominal swelling? No. ?Pain Score  0 * ? ?Have you tolerated food without any problems? Yes.   ? ?Have you been able to return to your normal activities? Yes.   ? ?Do you have any questions about your discharge instructions: ?Diet   No. ?Medications  No. ?Follow up visit  No. ? ?Do you have questions or concerns about your Care? No. ? ?Actions: ?* If pain score is 4 or above: ?No action needed, pain <4. ? ?Have you developed a fever since your procedure? no ? ?2.   Have you had an respiratory symptoms (SOB or cough) since your procedure? no ? ?3.   Have you tested positive for COVID 19 since your procedure no ? ?4.   Have you had any family members/close contacts diagnosed with the COVID 19 since your procedure?  no ? ? ?If yes to any of these questions please route to Joylene John, RN and Joella Prince, RN  ? ? ?

## 2021-06-06 ENCOUNTER — Encounter: Payer: BC Managed Care – PPO | Admitting: Gastroenterology

## 2021-06-13 ENCOUNTER — Encounter: Payer: BC Managed Care – PPO | Admitting: Gastroenterology

## 2021-08-09 ENCOUNTER — Other Ambulatory Visit (INDEPENDENT_AMBULATORY_CARE_PROVIDER_SITE_OTHER): Payer: Self-pay | Admitting: "Endocrinology

## 2021-09-07 ENCOUNTER — Other Ambulatory Visit (INDEPENDENT_AMBULATORY_CARE_PROVIDER_SITE_OTHER): Payer: Self-pay | Admitting: "Endocrinology

## 2021-10-24 ENCOUNTER — Telehealth (INDEPENDENT_AMBULATORY_CARE_PROVIDER_SITE_OTHER): Payer: Self-pay | Admitting: "Endocrinology

## 2021-10-24 ENCOUNTER — Encounter (INDEPENDENT_AMBULATORY_CARE_PROVIDER_SITE_OTHER): Payer: Self-pay

## 2021-10-24 ENCOUNTER — Other Ambulatory Visit (INDEPENDENT_AMBULATORY_CARE_PROVIDER_SITE_OTHER): Payer: Self-pay

## 2021-10-24 DIAGNOSIS — C73 Malignant neoplasm of thyroid gland: Secondary | ICD-10-CM

## 2021-10-24 NOTE — Telephone Encounter (Signed)
Sent patient mychart message

## 2021-10-24 NOTE — Telephone Encounter (Signed)
  Name of who is calling: Madelline  Caller's Relationship to Patient: Self  Best contact number: [phone]  Provider they see: Tobe Sos  Reason for call: Ferrah is asking does she need to get blood work before her appt on Thursday.      PRESCRIPTION REFILL ONLY  Name of prescription:  Pharmacy:

## 2021-10-26 NOTE — Progress Notes (Unsigned)
Subjective:  Patient Name: Leighton Luster Date of Birth: 11-Mar-1966  MRN: [id number]  Alessia Gonsalez  presents to the office today for follow up of her papillary thyroid cancer, post-surgical hypothyroidism, iatrogenic hyperthyroidism, morbid obesity, pre-diabetes, and hypertension.  HISTORY OF PRESENT ILLNESS:   Zaylie is a 56 y.o. Caucasian woman.  Kennetta was unaccompanied.  1. The patient developed thyroid nodules in 2002. A thyroid ultrasound on 01/03/01 showed a multinodular goiter with a dominant nodule in the right lobe measuring 21 x 21 x 29 mm. On 02/15/01 she underwent a total thyroidectomy. 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 There was no microscopic evidence of extension of the tumor beyond the capsule. In the left lobe a 0.8 cm focus of papillary thyroid cancer was noted. No lymphovascular invasion or extracapsular extension was noted. An I-131 scan performed on 05/11/01 showed residual uptake in the thyroid bed. On 05/27/01 she received a therapeutic I-131 dose of 40.3 mCi. 3. On 09/18/02 she had a follow up I-131 nuclear scan. This study demonstrated residual activity in the right thyroid bed, compatible with residual functioning thyroid tissue and/or tumor. On 10/01/02 she received a 27 mCi dose of I-131. On 06/26/03 she had a follow up I-131 scan which showed some residual activity in the right thyroid bed area. On 07/01/03 she received 100 mCi of I-131. Follow up I-131 scan on 01/18/04 showed no residual thyroid bed activity.  2. Clinical course: A. I assumed her care in 2006. Follow up I-131 scan on 01/03/05 showed no residual thyroid bed activity. After ensuring that her TSH was then appropriately suppressed by supraphysiologic doses of Synthroid, I ordered a Thyrogen-stimulated I-131 scan on 01/19/06. This study again showed no evidence of residual thyroid bed activity.  B. Since then I have followed her with  serial thyroglobulin measurements and anti-thyroglobulin antibody measurements. Her thyroglobulin values have consistently been below the lower limit of detection of the assay. Her anti-thyroglobulin antibody values have consistently been below 20. C. Her menses have ceased. LMP was in May 2021 and she had missed menses for about 4-5 months prior to then.  D. On 10/29/20 she went to the ED for the complaint of chest pressure in the left chest accompanied by left arm sensation changes. Her evaluation showed no acute ischemia. Her husband had an MI soon thereafter. He is doing well in August 2023.  3. The patient's last [facility] visit was on 12/02/20: I continued her Synthroid dosage of 112 mcg/day every day. She was supposed to return in 3 months, but did not.   A. In the interim she had been healthy.  B. She has regained weight. She has been fairly strict with her diet, but has not been walking.   C. She is taking her Synthroid in the mornings. She has not missed any doses. D. She is also taking ferrous sulfate, one 325 mg tablet each evening.   E. She developed a rash after her second [place] vaccination. She has not yet had the booster. She has decided not to get the new covid vaccination this year.   F. She still walks about 45 minutes per day, 3-4 times per week. She still consumes a lot of carbs.   4. Constitutional. Meia feels "good, great". She no longer has any hair loss.  Energy level is "good". She usually sleeps well. She pretty much only drinks water, but does drink coffee in the mornings. She  no longer gets unusually cold., but she is still having hot flashes. Her weight has increased another 26 pounds in the past 11 months.  Eyes: Brylynn's vision is slowly changing, c/w presbyopia. She now wears prescription glasses all the time. There are no other significant problems with her eyes.  Neck: Carsynn is not aware of any problems relating to the anterior neck and thyroid bed. She does sometimes have  the sensation of food sticking in her throat. There have not been any  significant problems of swelling, pain, soreness, tenderness, pressure, discomfort.  Heart: As above. Lakiesha feels the expected increase in heart rate during exercise or other physical activities. There have been no significant problems with palpitations, irregular heart beats, chest pain, or chest pressure. Gastrointestinal: She does not have much belly hunger. Stomach and intestines seem to be working normally. Bowel movements are normal. There are no significant complaints of excessive hunger, acid reflux, upset stomach, stomach aches or pains, diarrhea, or constipation. Hands: No problems, no tremor, no sweating Legs: Normal muscle bulk and strength. No edema. Feet: She had been having burning at rest in her the ball of her left foot, but she received a cortisone shot and the symptoms resolved.  Neuro: No problems with strength, sensation, or balance Psychological:  She is doing "fine'.   Mental: Azaria is still doing well in terms of her abilities to think, to pay attention, to remember, and to make decisions GYN: As above - She is having hot flashes.  PAST MEDICAL, FAMILY, AND SOCIAL HISTORY:  Past Medical History:  Diagnosis Date   Allergy    Cancer ([place])    thyroid cancer   Thyroid disease     Family History  Problem Relation Age of Onset   Colon polyps Mother    Colon polyps Father    Colon cancer Maternal Grandmother    Esophageal cancer Neg Hx    Rectal cancer Neg Hx    Stomach cancer Neg Hx      Current Outpatient Medications:    Biotin 10 MG CAPS, Take by mouth., Disp: , Rfl:    ferrous sulfate 325 (65 FE) MG tablet, TAKE 1 TABLET BY MOUTH EVERY DAY, Disp: 90 tablet, Rfl: 0   Homeopathic Products (ALLERGY MEDICINE PO), Take by mouth. Alternates claritin and zyrtec generic allergy season, Disp: , Rfl:    Multiple Vitamins-Minerals (ONE-A-DAY WOMENS PO), Take by mouth., Disp: , Rfl:    SYNTHROID 112  MCG tablet, TAKE 1 TABLET BY MOUTH EVERY DAY BEFORE BREAKFAST, Disp: 90 tablet, Rfl: 3   Zinc 100 MG TABS, Take by mouth., Disp: , Rfl:   Allergies as of 10/27/2021 - Review Complete 10/27/2021  Allergen Reaction Noted   Prednisone Other (See Comments) 12/29/2019    1. 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 She finished her master's degree in education in August 2016.  2. Activities: Walking 3. Smoking, alcohol, or drugs: None 4. Primary Care Provider: [facility]. [facility] [place]: There are no other significant problems involving Aiana's other body systems.   Objective:  Vital Signs:  BP 118/78 (BP Location: Left Arm, Patient Position: Sitting, Cuff Size: Large) Comment: check twice per pts request, 1st time was 122/74  Pulse 88   Ht 5' 5.12" (1.654 m)   Wt 285 lb (129.3 kg)   BMI 47.25 kg/m    Ht Readings from Last 3 Encounters:  10/27/21 5' 5.12" (  1.654 m)  05/23/21 '5\' 6"'$  (1.676 m)  05/09/21 '5\' 5"'$  (1.651 m)   Wt Readings from Last 3 Encounters:  10/27/21 285 lb (129.3 kg)  05/23/21 270 lb (122.5 kg)  05/09/21 270 lb (122.5 kg)    PHYSICAL EXAM:  Constitutional: The patient looks good today, but heavier. She is bright and alert. Her affect and insight are good. She has gained 26 pounds since her last visit.  Face: Her face appears normal.  Eyes: There is no obvious arcus or proptosis. Moisture appears normal. Mouth: The oropharynx and tongue appear normal. Oral moisture is normal. Neck: The neck appears to be visibly normal. No carotid bruits are noted. The thyroid gland is absent. The previous induration of her left strap muscle has resolved. She has no palpable cervical nodes or masses in the thyroid bed. Her supraclavicular areas are clear. She does not have any significant lymphadenopathy in the anterior or posterior neck..  Lungs: Clear, moves air well Heart: Regular rate and rhythm, Normal S1  and S2 Abdomen: More enlarged, soft, non-tender Hands: No tremor, normal palms  Legs: No edema Neuro: 5+ strength in the UEs and LEs. Sensation to touch is intact in both legs.  Scalp: She does not have any areas of thinning.  LAB DATA:  Labs 10/26/21: HbA1c 5.2%  Labs 10/25/21: TSH 6.42, free T4 1.3, free T3 2.6, thyroglobulin <0.1, thyroglobulin antibody <1  Labs 12/02/20: HbA1c 4.8%; CBG 80; TSH 1.86, free T4 1.5, free T3 2.5; PTH 27 (ref 16-77), calcium 8.8 (ref 8.6-10.4), 25-OH vitamin D 24, calcitriol 49 (ref 18-72)  Labs 10/29/20: CMP normal, except glucose 134 and calcium 8.7 (ref 8.9-10.3) ; CBC normal; troponin I 3 (ref ,18)  Labs 06/08/20: HbA1c 4.8%, CBG 89; TSH 1.56, free T4 1.3, free T3 2.2  Labs 03/04/20: HbA1c 5.2%, CBG 83; TSH 0.10, free T4 2.7, free T3 1.6  Labs 12/11/19: TSH 0.01, free T4 1.9, free T3 2.7, thyroglobulin <0.1, thyroglobulin antibody <1  Labs 10/02/19: HbA1c 5.1%, CBG 91  Labs 09/30/19: TSH <0.01, free T4 2.4 (ref 0.8-1.8),  free T3 3.8  Labs 09/09/19: TSH  0.008, free T4 3.0  Labs 10/02/18: HbA1c 5.0%, CBG 94; TSH 0.02, free T4 1.7, free T3 2.7, thyroglobulin <0.1 (ref < 0.1), thyroglobulin antibody <1 (ref < or = 1)  Labs 07/03/17: HbA1c 5.0%; TSH 0.01, free T4 1.9, free T3 2.6, thyroglobulin <0.1, thyroglobulin antibody <1  Labs 04/16/17: TSH 0.02, free T4 1.9, free T3 2.8  Labs 01/08/17: HbA1c 5.2%, CBG 90  Labs 07/04/16: HbA1c 4.9%, CBG 70  Labs 06/05/16: TSH 3.54, free T4 1.2, free T3 2.2, thyroglobulin <0.1, thyroglobulin antibody <1  Labs 12/09/15: HbA1c 4.9%; TSH 0.01, free T4 2.4, free T3 3.5  Labs 05/29/15: HbA1c 5.3%; TSH 0.01, free T4 3.4, free T3 5.4, thyroglobulin <0.1, thyroglobulin antibody <1, TPO antibody 7  Labs 12/15/14: HbA1c 4.9%; other labs pending  Labs 06/08/14; HbA1c 5.4%; TSH < 0.008, free T4 2.83, free T3 4.4, thyroglobulin <0.1, anti-thyroglobulin antibody <1  Labs 12/10/13: HbA1c 5.0%  Lab 06/03/13: HbA1c 5.4%; TSH 0.017,  free T4 2.81, free T3 4.3, TPO antibody 11.2, thyroglobulin < 0.2, Tg antibody < 20  Lab 01/24/12: TSH 45.646, free T4 0.78, free T3 1.9, thyroglobulin < 0.2, Tg antibody < 20  Lab 11/10/10: TSH 4.896, free T4 1.50, free T3 2.4  Lab 06/26/10: Thyroglobulin < 0.2, thyroglobulin antibody < 20, TSH 0.199, free T4 1.71, free T3 2.7, HbA1c 4.7%   Assessment and Plan:  ASSESSMENT:  1-3. Thyroid cancer/post-surgical hypothyroidism/iatrogenic hyperthyroidism:   A. She has no clinical evidence for recurrence of thyroid cancer. Her thyroglobulin levels through August 2023 have been below the lower limit of detection of the assay ("unmeasurable") since we began measuring them at her first visit with me in 2006.  It appears that her thyroid cancer has been cured.  Her risk of having a recurrence of thyroid cancer is very, very low.   B. Since her thyroglobulin was very good in September 2017, but her TSH was still very suppressed, I reduced her Synthroid dose by about 7%. Since then we have adjusted her Synthroid dose several more times, to include reducing the dose in September 2021.   C. Since last visit she has been healthy and feels well, except for her episode of chest pressure. She has been walking with her husband recently as part of his post-MI care.  D. She is clinically euthyroid today in August 2023. Her TFTs in August 2023 were low. She needs more thyroid hormone.  4. Obesity:   A. She had re-gained 26 pounds.  B. She needs to continue walking and eat differently.     Thomas Hoff, Wegovy, Ozempic, and Rybelsus with her today.  5. Hypertension: Her BP is a bit higher. Walking more and losing more fat weight will help to control the BP over time. Given her recent chest pressure, I thought that it was prudent to start here on low-dose lisinopril However, when she saw Dr. Moreen Fowler, hr BP was good, so she did not start the medication. 6. Pre-diabetes: Her HbA1c had decreased to the mid-range  when she was actively losing weight, increased later, but has decreased again, in part due to her previous weight loss. Her HbA1c in September 2022 was mid-normal for a healthy 56 year-old woman. The HbA1c is a bit higher in August 2023 paralleling her weight gain, but still quit normal.  7. Hair loss: She does not have any scalp thinning per se.  I recommended that she take either Centrum for Women or One-a-Day for Women. 8. Irregular menses and hot flashes: She is in menopause  9-10. Hypocalcemia/vitamin D deficiency: Her calcium level was low at the ED. She does not drink much milk. She eats cheese, but not much yogurt. She is at risk for vitamin D deficiency, hyperparathyroidism, and osteopenia/porosis.   PLAN:  1. Diagnostic: Repeat HbA1c, lipid panel, PTH, calcium, and 25-OH vitamin D today.    2. Therapeutic: Continue to take 112 mcg of Synthroid/day for 6 days per week, but increase the dosage to 224 mcg = 2 pills one cay per week.  Adjust the Synthroid dose as needed to maintain the TSH level in the 1.0-2.0 range.  3. Patient education: We discussed the issues of thyroid cancer, hyperthyroidism, pre-diabetes, hypertension, obesity, and hair loss at length.  4. Follow up 3 -4 months with anew endocrine provider. I suggested Dr. Dagmar Hait and Dr. Alanson Aly at [facility].   Level of Service: This visit lasted in excess of 45 minutes. More than 50% of the visit was devoted to counseling.  Tillman Sers, MD, CDE Adult and Pediatric Endocrinology 10/27/2021 1:59 PM

## 2021-10-27 ENCOUNTER — Encounter (INDEPENDENT_AMBULATORY_CARE_PROVIDER_SITE_OTHER): Payer: Self-pay | Admitting: "Endocrinology

## 2021-10-27 ENCOUNTER — Encounter (INDEPENDENT_AMBULATORY_CARE_PROVIDER_SITE_OTHER): Payer: Self-pay

## 2021-10-27 ENCOUNTER — Ambulatory Visit (INDEPENDENT_AMBULATORY_CARE_PROVIDER_SITE_OTHER): Payer: BC Managed Care – PPO | Admitting: "Endocrinology

## 2021-10-27 VITALS — BP 118/78 | HR 88 | Ht 65.12 in | Wt 285.0 lb

## 2021-10-27 DIAGNOSIS — E890 Postprocedural hypothyroidism: Secondary | ICD-10-CM

## 2021-10-27 DIAGNOSIS — C73 Malignant neoplasm of thyroid gland: Secondary | ICD-10-CM | POA: Diagnosis not present

## 2021-10-27 DIAGNOSIS — R7303 Prediabetes: Secondary | ICD-10-CM | POA: Diagnosis not present

## 2021-10-27 DIAGNOSIS — I10 Essential (primary) hypertension: Secondary | ICD-10-CM

## 2021-10-27 DIAGNOSIS — E559 Vitamin D deficiency, unspecified: Secondary | ICD-10-CM

## 2021-10-27 LAB — POCT GLYCOSYLATED HEMOGLOBIN (HGB A1C): Hemoglobin A1C: 5.2 % (ref 4.0–5.6)

## 2021-10-27 LAB — THYROGLOBULIN ANTIBODY: Thyroglobulin Ab: <1 IU/mL (ref <=1)

## 2021-10-27 LAB — TSH: TSH: 6.42 mIU/L — ABNORMAL HIGH

## 2021-10-27 LAB — T3, FREE: T3, Free: 2.6 pg/mL (ref 2.3–4.2)

## 2021-10-27 LAB — THYROGLOBULIN LEVEL: Thyroglobulin: <0.1 ng/mL — ABNORMAL LOW

## 2021-10-27 LAB — T4, FREE: Free T4: 1.3 ng/dL (ref 0.8–1.8)

## 2021-10-27 NOTE — Patient Instructions (Signed)
Follow up visit in 3-4 months at [facility].  At [facility], we are committed to providing exceptional care. You will receive a patient satisfaction survey through text or email regarding your visit today. Your opinion is important to me. Comments are appreciated.

## 2021-11-24 ENCOUNTER — Other Ambulatory Visit (INDEPENDENT_AMBULATORY_CARE_PROVIDER_SITE_OTHER): Payer: Self-pay | Admitting: "Endocrinology

## 2022-01-11 ENCOUNTER — Other Ambulatory Visit: Payer: Self-pay | Admitting: Family Medicine

## 2022-01-11 DIAGNOSIS — Z1231 Encounter for screening mammogram for malignant neoplasm of breast: Secondary | ICD-10-CM

## 2022-03-17 ENCOUNTER — Ambulatory Visit
Admission: RE | Admit: 2022-03-17 | Discharge: 2022-03-17 | Disposition: A | Discharge status: Home / Self Care | Payer: BC Managed Care – PPO | Source: Ambulatory Visit | Attending: Family Medicine | Admitting: Family Medicine

## 2022-03-17 DIAGNOSIS — Z1231 Encounter for screening mammogram for malignant neoplasm of breast: Secondary | ICD-10-CM

## 2022-09-19 ENCOUNTER — Emergency Department (HOSPITAL_COMMUNITY): Payer: BC Managed Care – PPO

## 2022-09-19 ENCOUNTER — Encounter (HOSPITAL_COMMUNITY): Payer: Self-pay

## 2022-09-19 ENCOUNTER — Other Ambulatory Visit: Payer: Self-pay

## 2022-09-19 ENCOUNTER — Emergency Department (HOSPITAL_COMMUNITY)
Admission: EM | Admit: 2022-09-19 | Discharge: 2022-09-20 | Disposition: A | Discharge status: Home / Self Care | Payer: BC Managed Care – PPO | Attending: Emergency Medicine | Admitting: Emergency Medicine

## 2022-09-19 DIAGNOSIS — R55 Syncope and collapse: Secondary | ICD-10-CM | POA: Insufficient documentation

## 2022-09-19 DIAGNOSIS — Z85850 Personal history of malignant neoplasm of thyroid: Secondary | ICD-10-CM | POA: Insufficient documentation

## 2022-09-19 DIAGNOSIS — R202 Paresthesia of skin: Secondary | ICD-10-CM | POA: Diagnosis not present

## 2022-09-19 DIAGNOSIS — I951 Orthostatic hypotension: Secondary | ICD-10-CM

## 2022-09-19 LAB — COMPREHENSIVE METABOLIC PANEL
ALT: 57 U/L — ABNORMAL HIGH (ref 0–44)
AST: 39 U/L (ref 15–41)
Albumin: 3.5 g/dL (ref 3.5–5.0)
Alkaline Phosphatase: 87 U/L (ref 38–126)
Anion gap: 12 (ref 5–15)
BUN: 18 mg/dL (ref 6–20)
CO2: 25 mmol/L (ref 22–32)
Calcium: 9.2 mg/dL (ref 8.9–10.3)
Chloride: 101 mmol/L (ref 98–111)
Creatinine, Ser: 1.01 mg/dL — ABNORMAL HIGH (ref 0.44–1.00)
GFR, Estimated: >60 mL/min (ref >60)
Glucose, Bld: 141 mg/dL — ABNORMAL HIGH (ref 70–99)
Potassium: 3.9 mmol/L (ref 3.5–5.1)
Sodium: 138 mmol/L (ref 135–145)
Total Bilirubin: 0.6 mg/dL (ref 0.3–1.2)
Total Protein: 6.9 g/dL (ref 6.5–8.1)

## 2022-09-19 LAB — MAGNESIUM: Magnesium: 1.9 mg/dL (ref 1.7–2.4)

## 2022-09-19 LAB — CBC WITH DIFFERENTIAL/PLATELET
Abs Immature Granulocytes: 0.02 10*3/uL (ref 0.00–0.07)
Basophils Absolute: 0 10*3/uL (ref 0.0–0.1)
Basophils Relative: 1 %
Eosinophils Absolute: 0.2 10*3/uL (ref 0.0–0.5)
Eosinophils Relative: 2 %
HCT: 45 % (ref 36.0–46.0)
Hemoglobin: 14.4 g/dL (ref 12.0–15.0)
Immature Granulocytes: 0 %
Lymphocytes Relative: 21 %
Lymphs Abs: 1.7 10*3/uL (ref 0.7–4.0)
MCH: 28.7 pg (ref 26.0–34.0)
MCHC: 32 g/dL (ref 30.0–36.0)
MCV: 89.8 fL (ref 80.0–100.0)
Monocytes Absolute: 0.6 10*3/uL (ref 0.1–1.0)
Monocytes Relative: 8 %
Neutro Abs: 5.5 10*3/uL (ref 1.7–7.7)
Neutrophils Relative %: 68 %
Platelets: 193 10*3/uL (ref 150–400)
RBC: 5.01 MIL/uL (ref 3.87–5.11)
RDW: 13.2 % (ref 11.5–15.5)
WBC: 8 10*3/uL (ref 4.0–10.5)
nRBC: 0 % (ref 0.0–0.2)

## 2022-09-19 LAB — LIPASE, BLOOD: Lipase: 39 U/L (ref 11–51)

## 2022-09-19 LAB — D-DIMER, QUANTITATIVE: D-Dimer, Quant: 1.13 ug/mL-FEU — ABNORMAL HIGH (ref 0.00–0.50)

## 2022-09-19 LAB — TROPONIN I (HIGH SENSITIVITY): Troponin I (High Sensitivity): 2 ng/L (ref <18)

## 2022-09-19 MED ORDER — IOHEXOL 350 MG/ML SOLN
75.0000 mL | Freq: Once | INTRAVENOUS | Status: AC | PRN
  Administered 2022-09-19: 75 mL via INTRAVENOUS

## 2022-09-19 MED ORDER — SODIUM CHLORIDE 0.9 % IV BOLUS
500.0000 mL | Freq: Once | INTRAVENOUS | Status: AC
  Administered 2022-09-19: 500 mL via INTRAVENOUS

## 2022-09-19 NOTE — ED Provider Notes (Signed)
Emergency Department Provider Note   I have reviewed the triage vital signs and the nursing notes.   HISTORY  Chief Complaint No chief complaint on file.   HPI Becky Martinez is a 57 y.o. female past history reviewed below presents emergency department for evaluation of 2 episodes of syncope in the postoperative setting.  Recently had surgery on her left knee with [facility].  She has been recovering well at home and wearing a brace on her left leg.  She has had 2 episodes of syncope along with pins and needle sensation to the left face and left upper extremity.  She states the pins and needle sensation happens pretty much every night over the last week.  She does not have headache.  She does not feel numbness in this area or particular weakness.  Symptoms seem to resolve with rest.  No history of this in the past, however.  She tells me that she is mainly here for 2 episodes of syncope.  Both episodes occurred with standing and feeling lightheaded.  She had an initial episode 4 days prior with walking back from the bathroom.  She began to feel lightheaded mated to the bed at which point she lost consciousness for around 20 to 30 seconds.  This was witnessed by the husband, at bedside, who states he did not witness any seizure activity.  She regained consciousness without confusion.  No vomiting.  She had a second episode of syncope today while in the shower.  She was sitting on the chair in the shower and stood up at which point she felt lightheaded.  She was able to sit back down on the chair but then again lost consciousness briefly.  This was witnessed by her daughter who was helping her.  She does not recall any chest pain, pressure, palpitations, shortness of breath.   Past Medical History:  Diagnosis Date   Allergy    Cancer ([facility])    thyroid cancer   Thyroid disease     Review of Systems  Constitutional: No fever/chills Cardiovascular: Denies chest pain. Positive syncope x 2.   Respiratory: Denies shortness of breath. Gastrointestinal: No abdominal pain.  No nausea, no vomiting.  Musculoskeletal: Post-op leg pain and swelling.  Skin: Negative for rash. Neurological: Negative for headaches, focal weakness or numbness. Tingling intermittently to the left face and LUE.    ____________________________________________   PHYSICAL EXAM:  VITAL SIGNS: ED Triage Vitals [09/19/22 2028]  Enc Vitals Group     BP 123/67     Pulse Rate 65     Resp 20     Temp 98.2 F (36.8 C)     Temp src      SpO2 99 %   Constitutional: Alert and oriented. Well appearing and in no acute distress. Eyes: Conjunctivae are normal.  Head: Atraumatic. Nose: No congestion/rhinnorhea. Mouth/Throat: Mucous membranes are moist. Neck: No stridor.   Cardiovascular: Normal rate, regular rhythm. Good peripheral circulation. Grossly normal heart sounds.   Respiratory: Normal respiratory effort.  No retractions. Lungs CTAB. Gastrointestinal: Soft and nontender. No distention.  Musculoskeletal: No lower extremity tenderness nor edema. No gross deformities of extremities. Neurologic:  Normal speech and language. Normal sensation to the face. Normal sensation. 5/5 strength in the bilateral upper extremities. Difficult to assess strength in the LLE with post-op pain and brace in place.  Skin:  Skin is warm, dry and intact. No rash noted.   ____________________________________________   LABS (all labs ordered are listed, but  only abnormal results are displayed)  Labs Reviewed  COMPREHENSIVE METABOLIC PANEL  LIPASE, BLOOD  CBC WITH DIFFERENTIAL/PLATELET  D-DIMER, QUANTITATIVE  URINALYSIS, W/ REFLEX TO CULTURE (INFECTION SUSPECTED)  MAGNESIUM  TROPONIN I (HIGH SENSITIVITY)   ____________________________________________  EKG   EKG Interpretation  Date/Time:  Tuesday September 19 2022 20:23:59 EDT Ventricular Rate:  68 PR Interval:  156 QRS Duration: 113 QT Interval:  402 QTC  Calculation: 428 R Axis:   -23 Text Interpretation: Sinus rhythm Borderline intraventricular conduction delay Low voltage, precordial leads RSR' in V1 or V2, right VCD or RVH Borderline T abnormalities, anterior leads Confirmed by Alona Bene [phone]) on 09/19/2022 8:27:32 PM        ____________________________________________  RADIOLOGY  No results found.  ____________________________________________   PROCEDURES  Procedure(s) performed:   Procedures   ____________________________________________   INITIAL IMPRESSION / ASSESSMENT AND PLAN / ED COURSE  Pertinent labs & imaging results that were available during my care of the patient were reviewed by me and considered in my medical decision making (see chart for details).   This patient is Presenting for Evaluation of syncope, which does require a range of treatment options, and is a complaint that involves a high risk of morbidity and mortality.  The Differential Diagnoses includes but is not exclusive to acute coronary syndrome, aortic dissection, pulmonary embolism, cardiac tamponade, community-acquired pneumonia, pericarditis, musculoskeletal chest wall pain, etc.   Critical Interventions-    Medications  sodium chloride 0.9 % bolus 500 mL (has no administration in time range)    Reassessment after intervention:     I did obtain Additional Historical Information from husband at bedside.   I decided to review pertinent External Data, and in summary patient with left knee surgery in the outpatient setting with [facility].   Clinical Laboratory Tests Ordered, included D dimer elevated. CBC without anemia.   Radiologic Tests Ordered, included CT head. I independently interpreted the images and agree with radiology interpretation.   Cardiac Monitor Tracing which shows NSR.    Social Determinants of Health Risk patient is a non-smoker.   Consult complete with  Medical Decision Making: Summary:  Patient  presents emergency department for evaluation of syncope x 2.  That pattern seems most consistent with orthostatic hypotension. She is in the post-op setting and PE is a consideration as well. The pins/needles sensation to the face and arm is intermittent and patient is neuro intact now. No code stroke activation. Will obtain CT head and reassess.   Reevaluation with update and discussion with   ***Considered admission***  Patient's presentation is most consistent with acute presentation with potential threat to life or bodily function.   Disposition:   ____________________________________________  FINAL CLINICAL IMPRESSION(S) / ED DIAGNOSES  Final diagnoses:  None     NEW OUTPATIENT MEDICATIONS STARTED DURING THIS VISIT:  New Prescriptions   No medications on file    Note:  This document was prepared using Dragon voice recognition software and may include unintentional dictation errors.  Alona Bene, MD, [facility] Emergency Medicine

## 2022-09-19 NOTE — ED Triage Notes (Signed)
Pt  bibg cems from home. Had meniscus surgery on 19th. Complications on 21st had first episode of syncope dizziness and numbness on left arm. Went to bed and felt fine in the morning. Has happened everyday after dinner since the 21st. Today had syncope episode did not hit her head.    Bp 136/82 Hr-67 Cbg 108 96%RA

## 2022-09-19 NOTE — ED Notes (Signed)
Pt transported to xray 

## 2022-09-20 ENCOUNTER — Emergency Department (HOSPITAL_COMMUNITY): Payer: BC Managed Care – PPO

## 2022-09-20 LAB — URINALYSIS, W/ REFLEX TO CULTURE (INFECTION SUSPECTED)
Bacteria, UA: NONE SEEN
Bilirubin Urine: NEGATIVE
Glucose, UA: NEGATIVE mg/dL
Hgb urine dipstick: NEGATIVE
Ketones, ur: NEGATIVE mg/dL
Nitrite: NEGATIVE
Protein, ur: NEGATIVE mg/dL
Specific Gravity, Urine: 1.036 — ABNORMAL HIGH (ref 1.005–1.030)
pH: 6 (ref 5.0–8.0)

## 2022-09-20 LAB — TROPONIN I (HIGH SENSITIVITY): Troponin I (High Sensitivity): 3 ng/L (ref <18)

## 2022-09-20 NOTE — ED Provider Notes (Signed)
I assumed care of this patient from previous provider.  Please see their note for further details of history, exam, and MDM.   Briefly patient is a 57 y.o. female who presented here with syncope .    Which appears to be orthostatic in nature.  Had a recent meniscal surgery and ruled out for PE.  Currently awaiting MRI given her mitten paresthesias of the face.  Anticipate discharge if MRI is negative.  MRI was negative.  Family and patient updated.  The patient appears reasonably screened and/or stabilized for discharge and I doubt any other medical condition or other [facility] requiring further screening, evaluation, or treatment in the ED at this time. I have discussed the findings, Dx and Tx plan with the patient/family who expressed understanding and agree(s) with the plan. Discharge instructions discussed at length. The patient/family was given strict return precautions who verbalized understanding of the instructions. No further questions at time of discharge.  Disposition: Discharge  Condition: Good  ED Discharge Orders     None      Follow Up: Tally Joe, MD [address] [place] [address] [phone]  Call  to schedule an appointment for close follow up      Codee Bloodworth, Amadeo Garnet, MD 09/20/22 [phone]

## 2022-09-20 NOTE — ED Notes (Signed)
Pt left before this nurse could get vitals

## 2023-02-15 ENCOUNTER — Other Ambulatory Visit: Payer: Self-pay | Admitting: Family Medicine

## 2023-02-15 DIAGNOSIS — Z1231 Encounter for screening mammogram for malignant neoplasm of breast: Secondary | ICD-10-CM

## 2023-03-23 ENCOUNTER — Ambulatory Visit: Payer: BC Managed Care – PPO

## 2023-04-10 ENCOUNTER — Ambulatory Visit: Payer: 59

## 2023-04-26 ENCOUNTER — Ambulatory Visit
Admission: RE | Admit: 2023-04-26 | Discharge: 2023-04-26 | Disposition: A | Discharge status: Home / Self Care | Payer: 59 | Source: Ambulatory Visit | Attending: Family Medicine | Admitting: Family Medicine

## 2023-04-26 DIAGNOSIS — Z1231 Encounter for screening mammogram for malignant neoplasm of breast: Secondary | ICD-10-CM

## 2023-06-18 IMAGING — CT CT CARDIAC CORONARY ARTERY CALCIUM SCORE
3 series · 14 of 20 positions shown, 16 images · non-contrast
Comparison: None.
COMPARISON: None.

Addendum:
EXAM:
OVER-READ INTERPRETATION  CT CHEST

The following report is an over-read performed by radiologist Dr.
Tebatso Rashid Radube [REDACTED] on 02/25/2021. This
over-read does not include interpretation of cardiac or coronary
anatomy or pathology. The coronary calcium score interpretation by
the cardiologist is attached.
CLINICAL DATA: 54F for cardiovascular disease risk stratification
Coronary Calcium Score
TECHNIQUE: A gated, non-contrast computed tomography scan of the heart was
performed using 3mm slice thickness. Axial images were analyzed on a
dedicated workstation. Calcium scoring of the coronary arteries was
performed using the Agatston method.

[Series 2: ax lung · axial · 0.90mm/px · z∈[+1233,+1345]mm · 5 of 86 slices shown]
[im 15/86  lung]
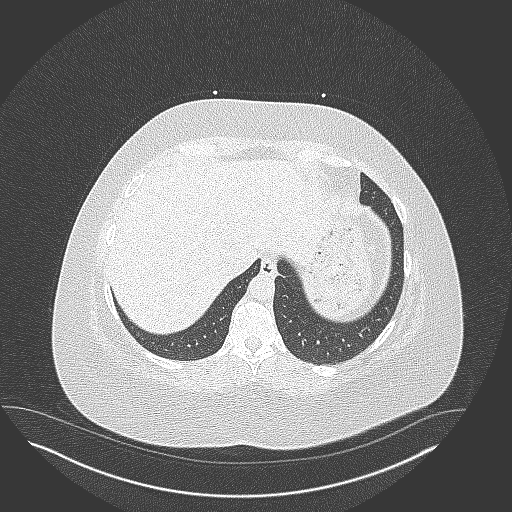
[im 29/86  lung]
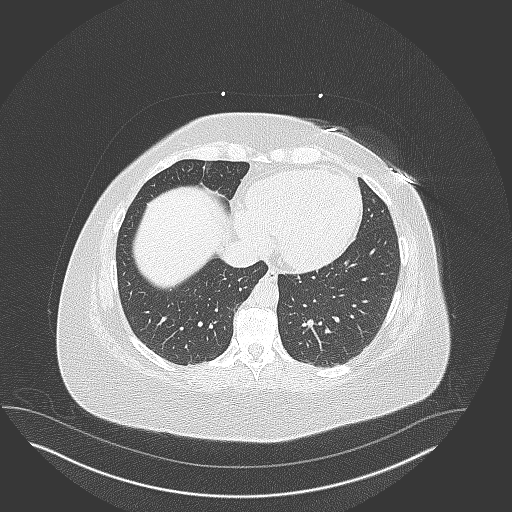
[im 43/86  lung]
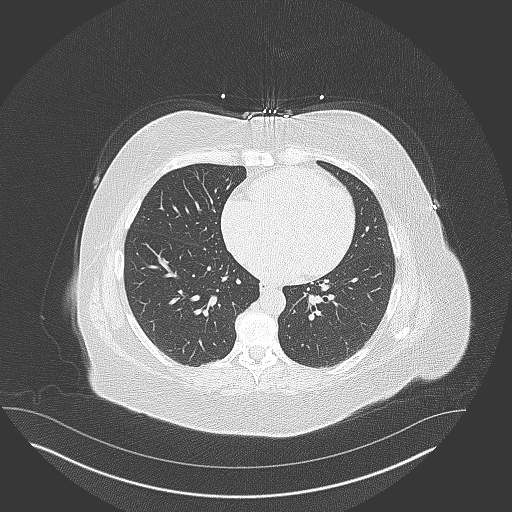
[im 57/86  lung]
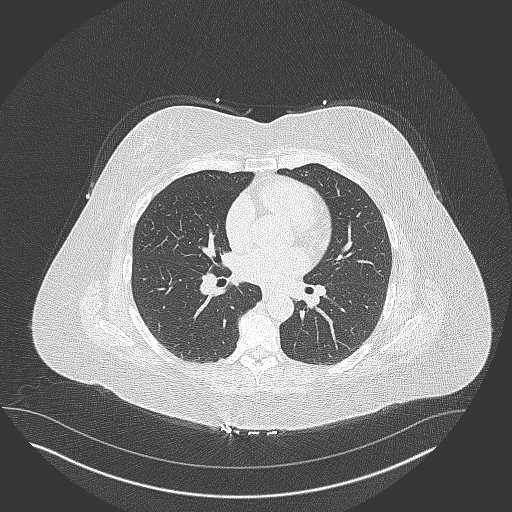
[im 71/86  lung]
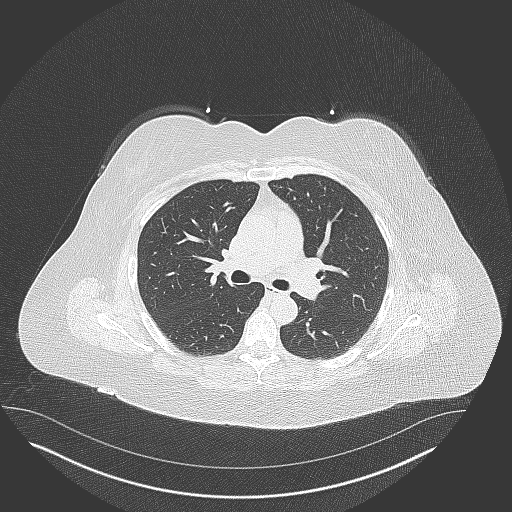

[Series 3: cascseq 3.0 sa36 70% (id) · axial · 0.30mm/px · z∈[+1249,+1333]mm · 3 of 57 slices shown]
[im 15/57  vessel]
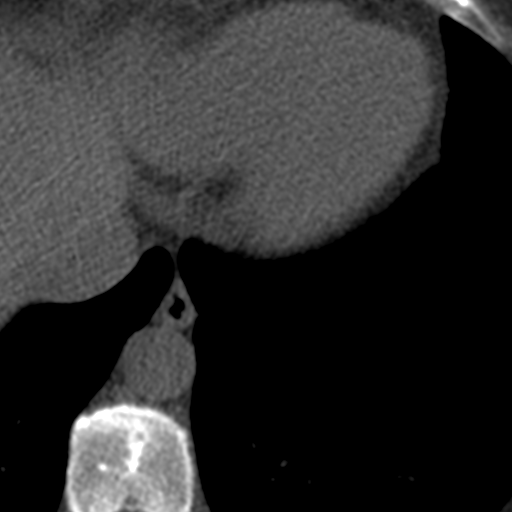
[im 29/57  vessel]
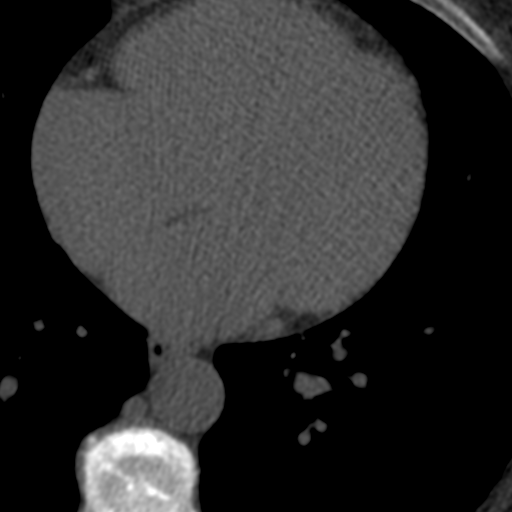
[im 43/57  vessel]
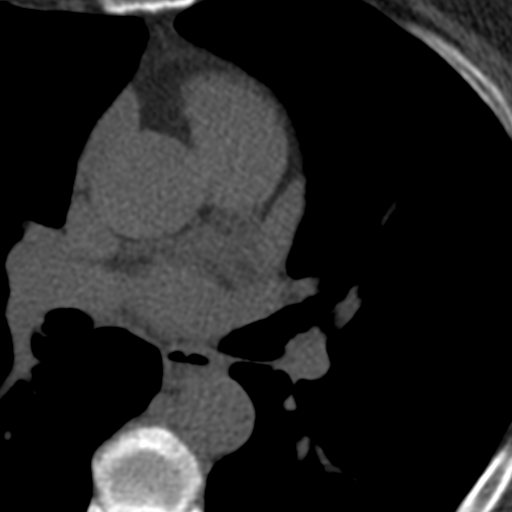

[Series 4: ax st · axial · 0.63mm/px · z∈[+1229,+1349]mm · 6 of 86 slices shown, 8 images]
[im 13/86  vessel]
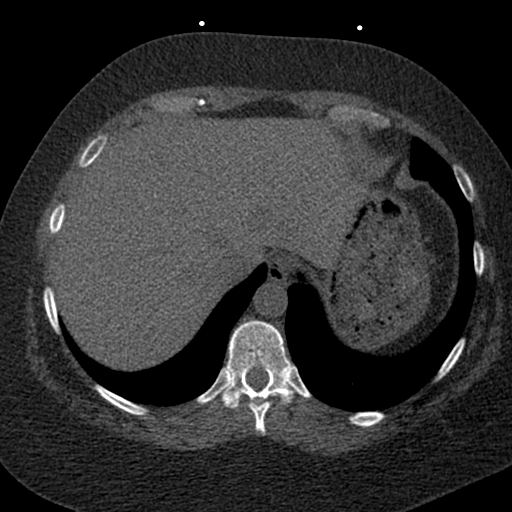
[im 13/86  lung]
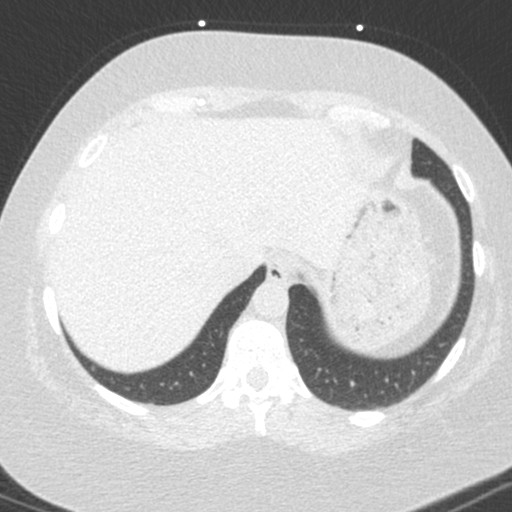
[im 25/86  vessel]
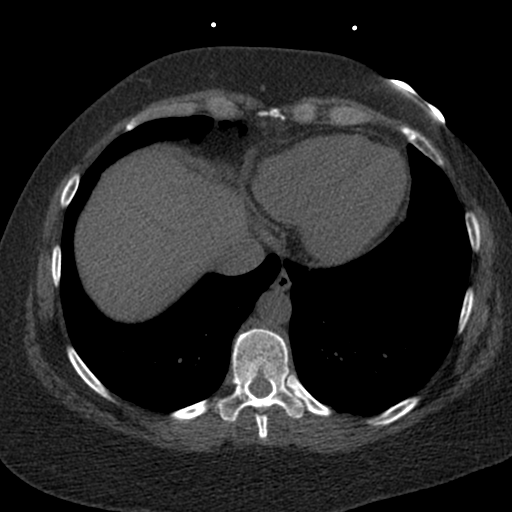
[im 37/86  vessel]
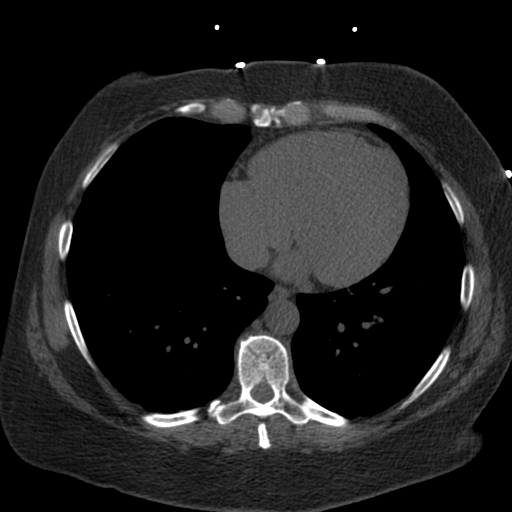
[im 49/86  vessel]
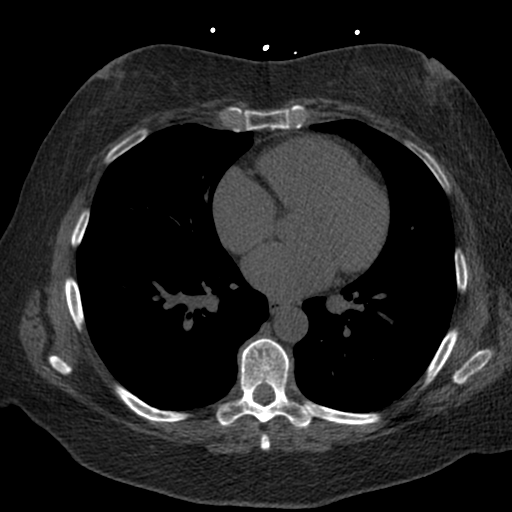
[im 61/86  vessel]
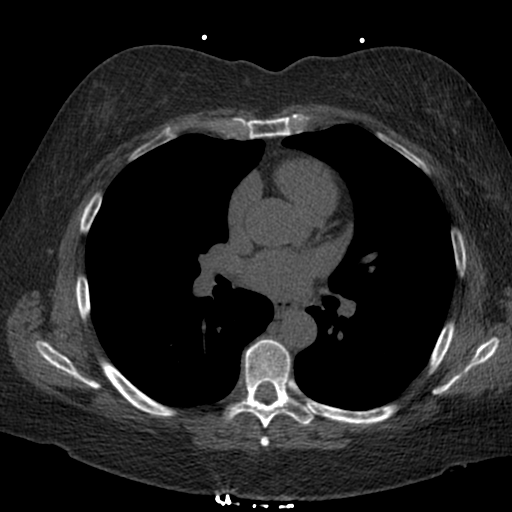
[im 61/86  lung]
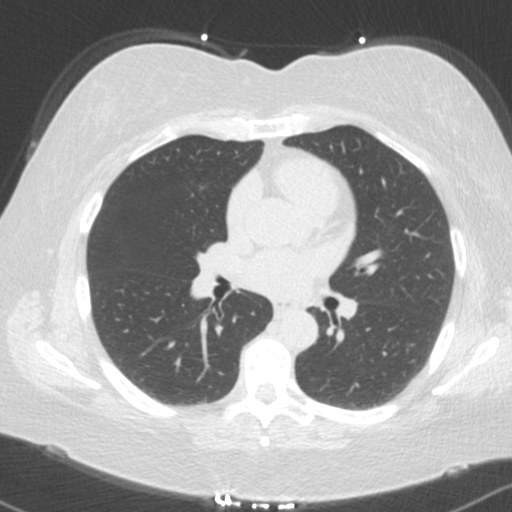
[im 73/86  vessel]
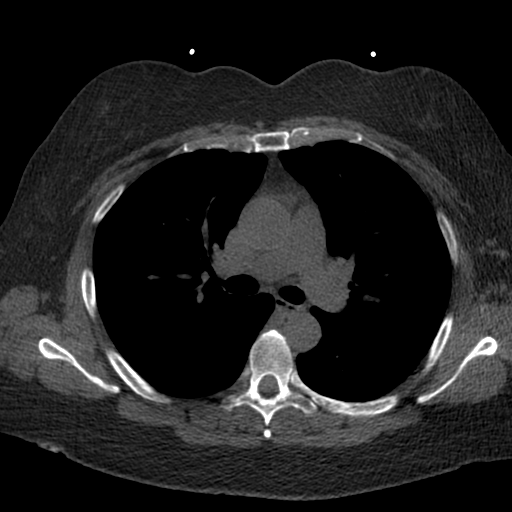

[14 of 20 positions shown; findings below may reference images not displayed]

FINDINGS: Within the visualized portions of the thorax there are no suspicious
appearing pulmonary nodules or masses, there is no acute
consolidative airspace disease, no pleural effusions, no
pneumothorax and no lymphadenopathy. Visualized portions of the
upper abdomen are unremarkable. There are no aggressive appearing
lytic or blastic lesions noted in the visualized portions of the
skeleton.
IMPRESSION: 1. No significant incidental noncardiac findings are noted.
FINDINGS: Coronary arteries: Normal origins.

Coronary Calcium Score: 0

Pericardium: Normal.

Ascending Aorta: Normal caliber.

Non-cardiac: See separate report from [REDACTED].
IMPRESSION: Coronary calcium score of 0. This was 0 percentile for age-, race-,
and sex-matched controls.



If CAC=0, it is reasonable to withhold statin therapy and reassess
in 5 to 10 years, as long as higher risk conditions are absent
(diabetes mellitus, family history of premature CHD in first degree
relatives (males <55 years; females <65 years), cigarette smoking,
or LDL >=190 mg/dL).

If CAC is 1 to 99, it is reasonable to initiate statin therapy for
patients >=55 years of age.

If CAC is >=100 or >=75th percentile, it is reasonable to initiate
statin therapy at any age.

Cardiology referral should be considered for patients with CAC
scores >=400 or >=75th percentile.

*6710 AHA/ACC/AACVPR/AAPA/ABC/BANGTHATHEAD/SIMEON-OR/TIGER/Soonchul/MICHALAK/ASFAW/JONNEZ
Guideline on the Management of Blood Cholesterol: A Report of the
American College of Cardiology/American Heart Association Task Force
on Clinical Practice Guidelines. J Am Coll Cardiol.
9165;73(24):9349-9524.

*** End of Addendum ***
EXAM:
OVER-READ INTERPRETATION  CT CHEST

The following report is an over-read performed by radiologist Dr.
Tebatso Rashid Radube [REDACTED] on 02/25/2021. This
over-read does not include interpretation of cardiac or coronary
anatomy or pathology. The coronary calcium score interpretation by
the cardiologist is attached.
FINDINGS: Within the visualized portions of the thorax there are no suspicious
appearing pulmonary nodules or masses, there is no acute
consolidative airspace disease, no pleural effusions, no
pneumothorax and no lymphadenopathy. Visualized portions of the
upper abdomen are unremarkable. There are no aggressive appearing
lytic or blastic lesions noted in the visualized portions of the
skeleton.
IMPRESSION: 1. No significant incidental noncardiac findings are noted.

## 2023-10-22 ENCOUNTER — Telehealth: Payer: Self-pay | Admitting: Emergency Medicine

## 2023-10-22 NOTE — Telephone Encounter (Signed)
 Pt reports that she has pain in left side of upper chest and back/shoulder blades. Been going on for the past few days. Rates it about a 3-5 out of 10. It lasts about a minute, then goes away, then may come back a few hours later. It happens most of the time when sitting- has not noticed it at all with activity. She did go for a walk today and Saturday and did not notice any pain.   No sob, n/v, dizziness, jaw pain, arm pain, sweating- denies all.   If you experience active Chest Pain, including tightness, pressure, jaw pain, radiating pain to shoulder/upper arm/back,Shortness Of Breath, nausea, vomiting, and/or sweating- THEN CALL 911 AND GO TO THE NEAREST EMERGENCY ROOM  Appt scheduled for 11/09/23 with APP. Pt verbalized understanding of all information.

## 2023-10-22 NOTE — Telephone Encounter (Signed)
   Pt c/o of Chest Pain: STAT if active CP, including tightness, pressure, jaw pain, radiating pain to shoulder/upper arm/back, CP unrelieved by Nitro. Symptoms reported of SOB, nausea, vomiting, sweating.  1. Are you having CP right now? no    2. Are you experiencing any other symptoms (ex. SOB, nausea, vomiting, sweating)? Back pain/shoulder blades   3. Is your CP continuous or coming and going? Comes and goes   4. Have you taken Nitroglycerin? no   5. How long have you been experiencing CP? 2-3 days    6. If NO CP at time of call then end call with telling Pt to call back or call 911 if Chest pain returns prior to return call from triage team.

## 2023-11-08 NOTE — Progress Notes (Signed)
 Cardiology Office Note:  .   Date:  11/09/2023  ID:  Becky Martinez, DOB July 25, 1965, MRN [id number] PCP: Seabron Lenis, MD  [facility] Providers Cardiologist:  None {  History of Present Illness: .   Becky Martinez is a 58 y.o. female with history of hypothyroidism, thyroid  cancer 2007 status post thyroidectomy     Atypical chest pain 01/2021 referred to cardiology.  Reported stress and nonexertional left-sided chest pain with radiation to her left arm.  Felt to be MSK related. 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  She has not been seen by cardiology since then. Patient recently contacted our office complaining of left sided upper chest and back/shoulder pain.  Reportedly happens while they are sitting and not associated with activity.  Today patient presents for the same complaints as before.  She reports nonexertional sharp, stabbing chest pain for the past month that only occurs when she sits down and rests.  Sometimes it radiates into her back and her arm.  Lasts for 1 minute and self resolving.  She has no exertional symptoms.  She walks 2 miles 3 times a week and tolerates this without any difficulties or associated symptoms.  She has no history of smoking, drugs, alcohol.  Not a diabetic.  Reports very distant family history of coronary artery disease.  Otherwise she has no other acute complaints.  She is newly retired and feels that lately she has been a little bit more stressed trying to morph into her new lifestyle.   ROS: Denies: shortness of breath, orthopnea, peripheral edema, palpitations, decreased exercise intolerance, fatigue, lightheadedness.   Studies Reviewed: SABRA    EKG Interpretation Date/Time:  Friday November 09 2023 08:06:22 EDT Ventricular Rate:  67 PR Interval:  152 QRS Duration:  98 QT Interval:  438 QTC  Calculation: 462 R Axis:   -27  Text Interpretation: Normal sinus rhythm Incomplete right bundle branch block Minimal voltage criteria for LVH, may be normal variant ( R in aVL ) Confirmed by Darryle Currier [phone]) on 11/09/2023 8:11:04 AM    Risk Assessment/Calculations:             Physical Exam:   VS:  BP 108/70 (BP Location: Left Arm, Patient Position: Sitting, Cuff Size: Large)   Pulse 67   Ht 5' 5 (1.651 m)   Wt 285 lb (129.3 kg)   SpO2 97%   BMI 47.43 kg/m    Wt Readings from Last 3 Encounters:  11/09/23 285 lb (129.3 kg)  10/27/21 285 lb (129.3 kg)  05/23/21 270 lb (122.5 kg)    GEN: Well nourished, well developed in no acute distress NECK: No JVD; No carotid bruits CARDIAC: RRR, no murmurs, rubs, gallops RESPIRATORY:  Clear to auscultation without rales, wheezing or rhonchi  ABDOMEN: Soft, non-tender, non-distended EXTREMITIES:  No edema; No deformity   ASSESSMENT AND PLAN: .    Atypical chest pain Patient presenting with identical symptoms as when we evaluated her in 2022.  Had a CAC score of 0.  EKG with no acute ST-T wave changes.  She has almost no risk factors.  She continues to present with atypical, nonexertional,'s sharp, stabbing chest pain.  She has excellent functional capacity and reassuring workup.  The suspicion for progressive CAD in the last 3 years is very unlikely.  She admits this  could potentially be related to stress which I agree. Counseled on symptoms that would prompt another evaluation but for now continue to monitor and she will follow-up if she has continued concerns.  BMI 47.43 Continue to encourage lifestyle modifications and daily exercise.  Further management per PCP.     Dispo: Follow-up as needed  Signed, Thom LITTIE Sluder, PA-C

## 2023-11-09 ENCOUNTER — Encounter: Payer: Self-pay | Admitting: General Practice

## 2023-11-09 ENCOUNTER — Ambulatory Visit: Attending: General Practice | Admitting: Cardiology

## 2023-11-09 VITALS — BP 108/70 | HR 67 | Ht 65.0 in | Wt 285.0 lb

## 2023-11-09 DIAGNOSIS — R079 Chest pain, unspecified: Secondary | ICD-10-CM

## 2023-11-09 NOTE — Patient Instructions (Signed)
 Medication Instructions:  Your physician recommends that you continue on your current medications as directed. Please refer to the Current Medication list given to you today.  *If you need a refill on your cardiac medications before your next appointment, please call your pharmacy*  Lab Work: NONE ordered at this time of appointment   Testing/Procedures: NONE ordered at this time of appointment   Follow-Up: At [facility], you and your health needs are our priority.  As part of our continuing mission to provide you with exceptional heart care, our providers are all part of one team.  This team includes your primary Cardiologist (physician) and Advanced Practice Providers or APPs (Physician Assistants and Nurse Practitioners) who all work together to provide you with the care you need, when you need it.  Your next appointment:    Follow up as needed  Provider:   None    We recommend signing up for the patient portal called MyChart.  Sign up information is provided on this After Visit Summary.  MyChart is used to connect with patients for Virtual Visits (Telemedicine).  Patients are able to view lab/test results, encounter notes, upcoming appointments, etc.  Non-urgent messages can be sent to your provider as well.   To learn more about what you can do with MyChart, go to [email or web address].

## 2023-11-30 ENCOUNTER — Ambulatory Visit: Admitting: Emergency Medicine

## 2024-03-12 ENCOUNTER — Other Ambulatory Visit: Payer: Self-pay | Admitting: Family Medicine

## 2024-03-12 DIAGNOSIS — Z1231 Encounter for screening mammogram for malignant neoplasm of breast: Secondary | ICD-10-CM

## 2024-04-28 ENCOUNTER — Ambulatory Visit

## 2024-04-29 ENCOUNTER — Ambulatory Visit
Admission: RE | Admit: 2024-04-29 | Discharge: 2024-04-29 | Disposition: A | Source: Ambulatory Visit | Attending: Family Medicine | Admitting: Family Medicine

## 2024-04-29 DIAGNOSIS — Z1231 Encounter for screening mammogram for malignant neoplasm of breast: Secondary | ICD-10-CM
